# Patient Record
Sex: Female | Born: 1975 | Race: Black or African American | Hispanic: No | Marital: Married | Smoking: Former smoker
Health system: Southern US, Community
[De-identification: ages and names within clinical notes are randomized; demographics above are authoritative.]

## PROBLEM LIST (undated history)

## (undated) DIAGNOSIS — E079 Disorder of thyroid, unspecified: Secondary | ICD-10-CM

## (undated) DIAGNOSIS — N760 Acute vaginitis: Secondary | ICD-10-CM

## (undated) DIAGNOSIS — F32A Depression, unspecified: Secondary | ICD-10-CM

## (undated) DIAGNOSIS — G4700 Insomnia, unspecified: Secondary | ICD-10-CM

## (undated) DIAGNOSIS — Z800 Family history of malignant neoplasm of digestive organs: Secondary | ICD-10-CM

## (undated) DIAGNOSIS — J45909 Unspecified asthma, uncomplicated: Secondary | ICD-10-CM

## (undated) DIAGNOSIS — E039 Hypothyroidism, unspecified: Secondary | ICD-10-CM

## (undated) DIAGNOSIS — K219 Gastro-esophageal reflux disease without esophagitis: Secondary | ICD-10-CM

## (undated) DIAGNOSIS — B9689 Other specified bacterial agents as the cause of diseases classified elsewhere: Secondary | ICD-10-CM

## (undated) DIAGNOSIS — N921 Excessive and frequent menstruation with irregular cycle: Secondary | ICD-10-CM

## (undated) DIAGNOSIS — C50919 Malignant neoplasm of unspecified site of unspecified female breast: Secondary | ICD-10-CM

## (undated) DIAGNOSIS — F419 Anxiety disorder, unspecified: Secondary | ICD-10-CM

## (undated) DIAGNOSIS — Z923 Personal history of irradiation: Secondary | ICD-10-CM

## (undated) DIAGNOSIS — I10 Essential (primary) hypertension: Secondary | ICD-10-CM

## (undated) HISTORY — DX: Other specified bacterial agents as the cause of diseases classified elsewhere: N76.0

## (undated) HISTORY — DX: Malignant neoplasm of unspecified site of unspecified female breast: C50.919

## (undated) HISTORY — DX: Excessive and frequent menstruation with irregular cycle: N92.1

## (undated) HISTORY — DX: Other specified bacterial agents as the cause of diseases classified elsewhere: B96.89

## (undated) HISTORY — DX: Family history of malignant neoplasm of digestive organs: Z80.0

## (undated) HISTORY — PX: TUBAL LIGATION: SHX77

## (undated) HISTORY — DX: Disorder of thyroid, unspecified: E07.9

## (undated) HISTORY — PX: BREAST LUMPECTOMY: SHX2

---

## 1998-09-01 HISTORY — PX: LAPAROSCOPY: SHX197

## 1998-09-01 HISTORY — PX: OOPHORECTOMY: SHX86

## 2003-09-02 HISTORY — PX: DILATION AND CURETTAGE OF UTERUS: SHX78

## 2007-04-13 DIAGNOSIS — R7309 Other abnormal glucose: Secondary | ICD-10-CM | POA: Insufficient documentation

## 2007-04-13 DIAGNOSIS — K644 Residual hemorrhoidal skin tags: Secondary | ICD-10-CM | POA: Insufficient documentation

## 2007-05-04 ENCOUNTER — Ambulatory Visit: Payer: Self-pay | Admitting: Family Medicine

## 2015-03-09 ENCOUNTER — Encounter: Payer: Self-pay | Admitting: Family Medicine

## 2015-03-09 ENCOUNTER — Ambulatory Visit (INDEPENDENT_AMBULATORY_CARE_PROVIDER_SITE_OTHER): Payer: No Typology Code available for payment source | Admitting: Family Medicine

## 2015-03-09 VITALS — BP 126/84 | HR 104 | Temp 98.3°F | Resp 16 | Ht 64.5 in | Wt 167.0 lb

## 2015-03-09 DIAGNOSIS — F329 Major depressive disorder, single episode, unspecified: Secondary | ICD-10-CM | POA: Diagnosis not present

## 2015-03-09 DIAGNOSIS — I10 Essential (primary) hypertension: Secondary | ICD-10-CM | POA: Insufficient documentation

## 2015-03-09 DIAGNOSIS — J45909 Unspecified asthma, uncomplicated: Secondary | ICD-10-CM | POA: Insufficient documentation

## 2015-03-09 DIAGNOSIS — D649 Anemia, unspecified: Secondary | ICD-10-CM | POA: Insufficient documentation

## 2015-03-09 DIAGNOSIS — K219 Gastro-esophageal reflux disease without esophagitis: Secondary | ICD-10-CM | POA: Insufficient documentation

## 2015-03-09 DIAGNOSIS — G4700 Insomnia, unspecified: Secondary | ICD-10-CM | POA: Insufficient documentation

## 2015-03-09 DIAGNOSIS — J309 Allergic rhinitis, unspecified: Secondary | ICD-10-CM | POA: Insufficient documentation

## 2015-03-09 DIAGNOSIS — R519 Headache, unspecified: Secondary | ICD-10-CM | POA: Insufficient documentation

## 2015-03-09 DIAGNOSIS — R1013 Epigastric pain: Secondary | ICD-10-CM | POA: Diagnosis not present

## 2015-03-09 DIAGNOSIS — F32A Depression, unspecified: Secondary | ICD-10-CM | POA: Insufficient documentation

## 2015-03-09 DIAGNOSIS — R51 Headache: Secondary | ICD-10-CM

## 2015-03-09 DIAGNOSIS — K5900 Constipation, unspecified: Secondary | ICD-10-CM | POA: Insufficient documentation

## 2015-03-09 MED ORDER — BUPROPION HCL ER (SR) 150 MG PO TB12: 150.0000 mg | ORAL_TABLET | Freq: Two times a day (BID) | ORAL | Status: DC

## 2015-03-09 NOTE — Progress Notes (Signed)
Subjective:    Patient ID: Ebony Steele, female    DOB: [birth year], 39 y.o.   MRN: [id number]  Abdominal Pain This is a new problem. The current episode started more than 1 month ago. The onset quality is gradual. The problem occurs daily. The problem has been gradually worsening (over the last 2 weeks). The pain is located in the periumbilical region. The pain is at a severity of 5/10. The pain is severe (gets up to a 10). The quality of the pain is dull (throbbing). The abdominal pain does not radiate. Associated symptoms include anorexia, belching, constipation, diarrhea, flatus (with diarrhea) and nausea. Pertinent negatives include no arthralgias, dysuria, fever, frequency, headaches, hematochezia, hematuria, melena, myalgias, vomiting or weight loss. The pain is aggravated by eating. She has tried nothing for the symptoms.  Pt's LMP was 02/15/2015, and pt is s/p tubal ligation, as well as s/p unilateral oophorectomy.     Review of Systems  Constitutional: Negative for fever and weight loss.  Gastrointestinal: Positive for nausea, abdominal pain, diarrhea, constipation, anorexia and flatus (with diarrhea). Negative for vomiting, melena and hematochezia.  Genitourinary: Negative for dysuria, frequency and hematuria.  Musculoskeletal: Negative for myalgias and arthralgias.  Neurological: Negative for headaches.  Psychiatric/Behavioral: Positive for dysphoric mood.   BP 126/84 mmHg  Pulse 104  Temp(Src) 98.3 F (36.8 C) (Oral)  Resp 16  Ht 5' 4.5" (1.638 m)  Wt 167 lb (75.751 kg)  BMI 28.23 kg/m2  LMP 02/15/2015 (Exact Date)   Patient Active Problem List   Diagnosis Date Noted  . Allergic rhinitis 03/09/2015  . Absolute anemia 03/09/2015  . 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 Headache disorder 03/09/2015  . CN (constipation) 03/09/2015  . Clinical depression 03/09/2015  . Esophageal reflux 03/09/2015  . BP (high blood pressure) 03/09/2015  . Cannot sleep 03/09/2015  .  Adult hypothyroidism 04/19/2007  . Abnormal blood sugar 04/13/2007  . External hemorrhoids without complication 28/78/6767  . Big thyroid 04/13/2007  . Current tobacco use 04/13/2007   No past medical history on file. No current outpatient prescriptions on file prior to visit.   No current facility-administered medications on file prior to visit.   Allergies  Allergen Reactions  . Acetaminophen    Past Surgical History  Procedure Laterality Date  . Dilation and curettage of uterus  2005  . Tubal ligation    . Oophorectomy Right 2000    Tertoma  . Laparoscopy  2000   History   Social History  . Marital Status: Divorced    Spouse Name: N/A  . Number of Children: 3  . Years of Education: College   Occupational History  . 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 Smoking status: Former Smoker    Types: Cigars    Quit date: 06/01/2013  . Smokeless tobacco: Never Used     Comment: Smoked 1 cigar per day  . Alcohol Use: Yes     Comment: Occasional Wine  . Drug Use: No  . Sexual Activity: Not on file   Other Topics Concern  . Not on file   Social History Narrative   Family History  Problem Relation Age of Onset  . Diabetes Mother   . Depression Mother   . Hypertension Mother   . Glaucoma Mother   . Kidney disease Father   . Diabetes Father   . Diabetes Mellitus I Brother        Objective:  Physical Exam  Constitutional: She appears well-developed and well-nourished.  Cardiovascular: Normal rate and regular rhythm.   Pulmonary/Chest: Effort normal and breath sounds normal.  Abdominal: Soft. Bowel sounds are normal. There is tenderness (periumbilical ). There is no rebound and no guarding.   BP 126/84 mmHg  Pulse 104  Temp(Src) 98.3 F (36.8 C) (Oral)  Resp 16  Ht 5' 4.5" (1.638 m)  Wt 167 lb (75.751 kg)  BMI 28.23 kg/m2  LMP 02/15/2015 (Exact Date)     Assessment & Plan:  1. Epigastric pain Did not improve with  GI cocktail.  Will check labs and schedule ultrasound.   ER if worsens.   - CBC with Differential/Platelet - Amylase - Lipase - Comprehensive metabolic panel - GI COCKTAIL UP TO 45 CC - US Abdomen Complete; Future  2. Depression Will restart medication.   - buPROPion (WELLBUTRIN SR) 150 MG 12 hr tablet; Take 1 tablet (150 mg total) by mouth 2 (two) times daily.  Dispense: 60 tablet; Refill: 6   Margarita Rana, MD

## 2015-03-10 LAB — CBC WITH DIFFERENTIAL/PLATELET
BASOS ABS: 0 10*3/uL (ref 0.0–0.2)
Basos: 0 %
EOS (ABSOLUTE): 0.3 10*3/uL (ref 0.0–0.4)
EOS: 5 %
Hematocrit: 42.2 % (ref 34.0–46.6)
Hemoglobin: 14.3 g/dL (ref 11.1–15.9)
IMMATURE GRANS (ABS): 0 10*3/uL (ref 0.0–0.1)
IMMATURE GRANULOCYTES: 0 %
Lymphocytes Absolute: 2.5 10*3/uL (ref 0.7–3.1)
Lymphs: 49 %
MCH: 29.1 pg (ref 26.6–33.0)
MCHC: 33.9 g/dL (ref 31.5–35.7)
MCV: 86 fL (ref 79–97)
MONOCYTES: 8 %
Monocytes Absolute: 0.4 10*3/uL (ref 0.1–0.9)
Neutrophils Absolute: 1.9 10*3/uL (ref 1.4–7.0)
Neutrophils: 38 %
PLATELETS: 227 10*3/uL (ref 150–379)
RBC: 4.91 x10E6/uL (ref 3.77–5.28)
RDW: 13.8 % (ref 12.3–15.4)
WBC: 5.1 10*3/uL (ref 3.4–10.8)

## 2015-03-10 LAB — COMPREHENSIVE METABOLIC PANEL
A/G RATIO: 1.5 (ref 1.1–2.5)
ALT: 19 IU/L (ref 0–32)
AST: 17 IU/L (ref 0–40)
Albumin: 4.5 g/dL (ref 3.5–5.5)
Alkaline Phosphatase: 67 IU/L (ref 39–117)
BUN/Creatinine Ratio: 11 (ref 8–20)
BUN: 10 mg/dL (ref 6–20)
Bilirubin Total: 0.4 mg/dL (ref 0.0–1.2)
CO2: 22 mmol/L (ref 18–29)
Calcium: 9 mg/dL (ref 8.7–10.2)
Chloride: 103 mmol/L (ref 97–108)
Creatinine, Ser: 0.91 mg/dL (ref 0.57–1.00)
GFR calc Af Amer: 92 mL/min/{1.73_m2} (ref >59)
GFR, EST NON AFRICAN AMERICAN: 80 mL/min/{1.73_m2} (ref >59)
GLUCOSE: 90 mg/dL (ref 65–99)
Globulin, Total: 3 g/dL (ref 1.5–4.5)
POTASSIUM: 4.1 mmol/L (ref 3.5–5.2)
SODIUM: 140 mmol/L (ref 134–144)
Total Protein: 7.5 g/dL (ref 6.0–8.5)

## 2015-03-10 LAB — LIPASE: LIPASE: 30 U/L (ref 0–59)

## 2015-03-10 LAB — AMYLASE: Amylase: 118 U/L (ref 31–124)

## 2015-03-12 ENCOUNTER — Telehealth: Payer: Self-pay

## 2015-03-12 NOTE — Telephone Encounter (Signed)
-----   Message from Margarita Rana, MD sent at 03/10/2015  6:26 AM EDT ----- Labs normal. Please notify patient. Will await ultrasound results. Please see how patient is feeling.  Thanks.

## 2015-03-12 NOTE — Telephone Encounter (Signed)
Pt advised as directed below.  Her Ultrasound is scheduled for tomorrow. (03/13/2015)  Thanks,   -Mickel Baas

## 2015-03-13 ENCOUNTER — Telehealth: Payer: Self-pay

## 2015-03-13 ENCOUNTER — Ambulatory Visit
Admission: RE | Admit: 2015-03-13 | Discharge: 2015-03-13 | Disposition: A | Discharge status: Home / Self Care | Payer: 59 | Source: Ambulatory Visit | Attending: Family Medicine | Admitting: Family Medicine

## 2015-03-13 ENCOUNTER — Ambulatory Visit: Payer: Self-pay | Admitting: Family Medicine

## 2015-03-13 DIAGNOSIS — R109 Unspecified abdominal pain: Secondary | ICD-10-CM | POA: Diagnosis present

## 2015-03-13 DIAGNOSIS — R1013 Epigastric pain: Secondary | ICD-10-CM | POA: Insufficient documentation

## 2015-03-13 NOTE — Telephone Encounter (Signed)
Pt advised as directed below.  She is still having pain, but does not want to go to the GI yet.  She says she will call back when she is ready for the referral.   Thanks,   -Mickel Baas

## 2015-03-13 NOTE — Telephone Encounter (Signed)
-----   Message from Margarita Rana, MD sent at 03/13/2015 10:22 AM EDT ----- Ultrasound normal. Unclear why having symptoms. Can refer to GI if still with pain. Thanks.

## 2015-04-04 ENCOUNTER — Ambulatory Visit: Payer: No Typology Code available for payment source | Admitting: Family Medicine

## 2015-04-06 ENCOUNTER — Ambulatory Visit: Payer: No Typology Code available for payment source | Admitting: Family Medicine

## 2015-09-07 ENCOUNTER — Encounter: Payer: Self-pay | Admitting: Family Medicine

## 2015-09-07 ENCOUNTER — Ambulatory Visit (INDEPENDENT_AMBULATORY_CARE_PROVIDER_SITE_OTHER): Payer: 59 | Admitting: Family Medicine

## 2015-09-07 VITALS — BP 122/90 | HR 94 | Temp 98.3°F | Resp 16 | Ht 64.0 in | Wt 155.0 lb

## 2015-09-07 DIAGNOSIS — R634 Abnormal weight loss: Secondary | ICD-10-CM | POA: Diagnosis not present

## 2015-09-07 DIAGNOSIS — R55 Syncope and collapse: Secondary | ICD-10-CM | POA: Diagnosis not present

## 2015-09-07 DIAGNOSIS — R1013 Epigastric pain: Secondary | ICD-10-CM | POA: Diagnosis not present

## 2015-09-07 DIAGNOSIS — Z0000 Encounter for general adult medical examination without abnormal findings: Secondary | ICD-10-CM | POA: Diagnosis not present

## 2015-09-07 LAB — IFOBT (OCCULT BLOOD): IMMUNOLOGICAL FECAL OCCULT BLOOD TEST: NEGATIVE

## 2015-09-07 NOTE — Progress Notes (Signed)
Patient ID: Ebony Steele, female   DOB: 1975/11/24, 40 y.o.   MRN: [id number]       Patient: Ebony Steele, Female    DOB: [birth year], 40 y.o.   MRN: [id number] Visit Date: 09/07/2015  Today's Provider: Margarita Rana, MD   Chief Complaint  Patient presents with  . Annual Exam   Subjective:    Annual physical exam Ebony Steele is a 40 y.o. female who presents today for health maintenance and complete physical. She feels well. She reports exercising none. She reports she is sleeping well.  07/21/14 CPE 01/28/12 Pap-neg 12/12/10 HPV-neg 01/28/12 EKG  Lab Results  Component Value Date   WBC 5.1 03/09/2015   HCT 42.2 03/09/2015   PLT 227 03/09/2015   GLUCOSE 90 03/09/2015   ALT 19 03/09/2015   AST 17 03/09/2015   NA 140 03/09/2015   K 4.1 03/09/2015   CL 103 03/09/2015   CREATININE 0.91 03/09/2015   BUN 10 03/09/2015   CO2 22 03/09/2015   -----------------------------------------------------------------  Syncope: Patient complains of syncope. Onset was 6 days ago, with stable course since that time. Patient describes the episode as episode witnessed and the following was observed: mental status was normal immediately upon regaining consciousness. Patient also has associated symptoms of  tachycardia/palpitations, visual aura and dizzines. The patient denies melena. Taking culprit meds: none   Abdominal Pain: Patient complains of abdominal pain. The pain is described as aching, dull and sharp at times, and is 8/10 in intensity. Pain is located in the periumbilical bilateral without radiation. Onset was 6 months ago. Symptoms have been gradually worsening since. Aggravating factors: eating.  Alleviating factors: none. Associated symptoms: constipation and diarrhea. The patient denies melena.    Review of Systems  Constitutional: Positive for appetite change, fatigue and unexpected weight change.  Eyes: Negative.   Respiratory: Negative.   Cardiovascular: Negative.    Gastrointestinal: Positive for nausea, abdominal pain, diarrhea and constipation.  Endocrine: Negative.   Genitourinary: Negative.   Musculoskeletal: Negative.   Skin: Negative.   Allergic/Immunologic: Negative.   Neurological: Positive for dizziness, syncope, light-headedness and headaches.  Hematological: Negative.   Psychiatric/Behavioral: The patient is nervous/anxious.     Social History      She  reports that she has been smoking Cigars.  She has never used smokeless tobacco. She reports that she drinks alcohol. She reports that she does not use illicit drugs.       Social History   Social History  . Marital Status: Divorced    Spouse Name: N/A  . Number of Children: 3  . Years of Education: College   Occupational History  . 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 Smoking status: Current Some Day Smoker    Types: Cigars  . Smokeless tobacco: Never Used     Comment: Smoked 1 cigar per day  . Alcohol Use: Yes     Comment: Occasional Wine  . Drug Use: No  . Sexual Activity: Not Asked   Other Topics Concern  . None   Social History Narrative    Past Medical History  Diagnosis Date  . Thyroid disease      Patient Active Problem List   Diagnosis Date Noted  . Allergic rhinitis 03/09/2015  . Absolute anemia 03/09/2015  . 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 Headache disorder 03/09/2015  . CN (constipation) 03/09/2015  . Clinical depression 03/09/2015  . Esophageal reflux 03/09/2015  .  BP (high blood pressure) 03/09/2015  . Cannot sleep 03/09/2015  . Adult hypothyroidism 04/19/2007  . Abnormal blood sugar 04/13/2007  . External hemorrhoids without complication [id number]  . Big thyroid 04/13/2007  . Current tobacco use 04/13/2007    Past Surgical History  Procedure Laterality Date  . Dilation and curettage of uterus  2005  . Tubal ligation    . Oophorectomy Right 2000    Tertoma  . Laparoscopy  2000     Family History        Family Status  Relation Status Death Age  . Mother Alive   . Father Deceased   . Brother Alive         Her family history includes Depression in her mother; Diabetes in her father and mother; Diabetes Mellitus I in her brother; Glaucoma in her mother; Hypertension in her mother; Kidney disease in her father.    Allergies  Allergen Reactions  . Acetaminophen     Previous Medications   ALBUTEROL (PROVENTIL HFA) 108 (90 BASE) MCG/ACT INHALER    Inhale into the lungs.   BUPROPION (WELLBUTRIN SR) 150 MG 12 HR TABLET    Take 1 tablet (150 mg total) by mouth 2 (two) times daily.   CETIRIZINE (ZYRTEC ALLERGY) 10 MG TABLET    Take by mouth.   LEVOTHYROXINE (SYNTHROID, LEVOTHROID) 88 MCG TABLET    Take by mouth.   ONDANSETRON (ZOFRAN) 4 MG TABLET    Take 4 mg by mouth every 8 (eight) hours as needed for nausea or vomiting.   TRAZODONE (DESYREL) 50 MG TABLET    Take by mouth.    Patient Care Team: Margarita Rana, MD as PCP - General (Family Medicine)     Objective:   Vitals: BP 122/90 mmHg  Pulse 94  Temp(Src) 98.3 F (36.8 C)  Resp 16  Ht 5\' 4"  (1.626 m)  Wt 155 lb (70.308 kg)  BMI 26.59 kg/m2  SpO2 98%  LMP 08/25/2015 (Exact Date)   Physical Exam  Constitutional: She is oriented to person, place, and time. She appears well-developed and well-nourished.  HENT:  Head: Normocephalic and atraumatic.  Right Ear: Tympanic membrane, external ear and ear canal normal.  Left Ear: Tympanic membrane, external ear and ear canal normal.  Nose: Nose normal.  Mouth/Throat: Uvula is midline, oropharynx is clear and moist and mucous membranes are normal.  Eyes: Conjunctivae, EOM and lids are normal. Pupils are equal, round, and reactive to light.  Neck: Trachea normal and normal range of motion. Neck supple. Carotid bruit is not present. No thyroid mass and no thyromegaly present.  Cardiovascular: Normal rate, regular rhythm and normal heart sounds.    Pulmonary/Chest: Effort normal and breath sounds normal.  Abdominal: Soft. Normal appearance and bowel sounds are normal. There is no hepatosplenomegaly. There is no tenderness.  Genitourinary: No breast swelling, tenderness or discharge.  Musculoskeletal: Normal range of motion.  Lymphadenopathy:    She has no cervical adenopathy.    She has no axillary adenopathy.  Neurological: She is alert and oriented to person, place, and time. She has normal strength. No cranial nerve deficit.  Skin: Skin is warm, dry and intact.  Psychiatric: She has a normal mood and affect. Her speech is normal and behavior is normal. Judgment and thought content normal. Cognition and memory are normal.     Depression Screen PHQ 2/9 Scores 09/07/2015  PHQ - 2 Score 5  PHQ- 9 Score 19      Assessment & Plan:  Routine Health Maintenance and Physical Exam  Exercise Activities and Dietary recommendations Goals    . Exercise 150 minutes per week (moderate activity)       Immunization History  Administered Date(s) Administered  . Tdap 01/28/2012    1. Annual physical exam Normal exam today.  - EKG 12-Lead  2. Abnormal weight loss Has continued to have abdominal pain, now with weight loss.  Decreased appetite.  Will check labs, schedule CT scan, and refer to GI if all normal.   Needs to follow thru with work up at this time.   - IFOBT POC (occult bld, rslt in office) - Ambulatory referral to Gastroenterology - CBC with Differential/Platelet - Comprehensive metabolic panel - TSH - CT Abdomen Pelvis W Contrast; Future  3. Syncope and collapse EKG normal today. Did have pre-syncopal symptoms. Suspect related getting up too quickly and not eating.   Recommend increase eating and drinking.    4. Epigastric pain Will refer to GI.   - Ambulatory referral to Gastroenterology - CT Abdomen Pelvis W Contrast; Future   Patient was seen and examined by Jerrell Belfast, MD, and note scribed by  Lynford Humphrey, [place].   I have reviewed the document for accuracy and completeness and I agree with above. Jerrell Belfast, MD   Margarita Rana, MD    --------------------------------------------------------------------

## 2015-09-08 LAB — CBC WITH DIFFERENTIAL/PLATELET
Basophils Absolute: 0 10*3/uL (ref 0.0–0.2)
Basos: 1 %
EOS (ABSOLUTE): 0.2 10*3/uL (ref 0.0–0.4)
Eos: 5 %
Hematocrit: 41.2 % (ref 34.0–46.6)
Hemoglobin: 13.8 g/dL (ref 11.1–15.9)
Immature Grans (Abs): 0 10*3/uL (ref 0.0–0.1)
Immature Granulocytes: 0 %
LYMPHS ABS: 1.8 10*3/uL (ref 0.7–3.1)
LYMPHS: 40 %
MCH: 27.9 pg (ref 26.6–33.0)
MCHC: 33.5 g/dL (ref 31.5–35.7)
MCV: 83 fL (ref 79–97)
MONOS ABS: 0.5 10*3/uL (ref 0.1–0.9)
Monocytes: 12 %
NEUTROS PCT: 42 %
Neutrophils Absolute: 1.9 10*3/uL (ref 1.4–7.0)
Platelets: 268 10*3/uL (ref 150–379)
RBC: 4.95 x10E6/uL (ref 3.77–5.28)
RDW: 13 % (ref 12.3–15.4)
WBC: 4.6 10*3/uL (ref 3.4–10.8)

## 2015-09-08 LAB — COMPREHENSIVE METABOLIC PANEL
ALT: 41 IU/L — ABNORMAL HIGH (ref 0–32)
AST: 32 IU/L (ref 0–40)
Albumin/Globulin Ratio: 1.3 (ref 1.1–2.5)
Albumin: 4.3 g/dL (ref 3.5–5.5)
Alkaline Phosphatase: 78 IU/L (ref 39–117)
BILIRUBIN TOTAL: 0.4 mg/dL (ref 0.0–1.2)
BUN / CREAT RATIO: 9 (ref 8–20)
BUN: 8 mg/dL (ref 6–20)
CHLORIDE: 101 mmol/L (ref 96–106)
CO2: 21 mmol/L (ref 18–29)
CREATININE: 0.87 mg/dL (ref 0.57–1.00)
Calcium: 9.2 mg/dL (ref 8.7–10.2)
GFR calc Af Amer: 97 mL/min/{1.73_m2} (ref >59)
GFR calc non Af Amer: 84 mL/min/{1.73_m2} (ref >59)
GLOBULIN, TOTAL: 3.2 g/dL (ref 1.5–4.5)
Glucose: 81 mg/dL (ref 65–99)
POTASSIUM: 3.9 mmol/L (ref 3.5–5.2)
SODIUM: 136 mmol/L (ref 134–144)
Total Protein: 7.5 g/dL (ref 6.0–8.5)

## 2015-09-08 LAB — TSH: TSH: 2.11 u[IU]/mL (ref 0.450–4.500)

## 2015-09-10 ENCOUNTER — Encounter: Payer: Self-pay | Admitting: Gastroenterology

## 2015-09-17 ENCOUNTER — Telehealth: Payer: Self-pay

## 2015-09-17 ENCOUNTER — Other Ambulatory Visit: Payer: Self-pay | Admitting: Family Medicine

## 2015-09-17 ENCOUNTER — Ambulatory Visit
Admission: RE | Admit: 2015-09-17 | Discharge: 2015-09-17 | Disposition: A | Discharge status: Home / Self Care | Payer: 59 | Source: Ambulatory Visit | Attending: Family Medicine | Admitting: Family Medicine

## 2015-09-17 ENCOUNTER — Telehealth: Payer: Self-pay | Admitting: Family Medicine

## 2015-09-17 DIAGNOSIS — K5900 Constipation, unspecified: Secondary | ICD-10-CM | POA: Insufficient documentation

## 2015-09-17 DIAGNOSIS — R1013 Epigastric pain: Secondary | ICD-10-CM

## 2015-09-17 DIAGNOSIS — R634 Abnormal weight loss: Secondary | ICD-10-CM | POA: Insufficient documentation

## 2015-09-17 DIAGNOSIS — N83201 Unspecified ovarian cyst, right side: Secondary | ICD-10-CM

## 2015-09-17 DIAGNOSIS — R918 Other nonspecific abnormal finding of lung field: Secondary | ICD-10-CM | POA: Insufficient documentation

## 2015-09-17 DIAGNOSIS — R9389 Abnormal findings on diagnostic imaging of other specified body structures: Secondary | ICD-10-CM

## 2015-09-17 DIAGNOSIS — N83209 Unspecified ovarian cyst, unspecified side: Secondary | ICD-10-CM | POA: Insufficient documentation

## 2015-09-17 HISTORY — DX: Essential (primary) hypertension: I10

## 2015-09-17 HISTORY — DX: Unspecified asthma, uncomplicated: J45.909

## 2015-09-17 MED ORDER — IOHEXOL 300 MG/ML  SOLN
100.0000 mL | Freq: Once | INTRAMUSCULAR | Status: AC | PRN
  Administered 2015-09-17: 100 mL via INTRAVENOUS

## 2015-09-17 NOTE — Telephone Encounter (Signed)
Spoke to Dr. Janeece Fitting he reports that this is not related to history of asthma.

## 2015-09-17 NOTE — Telephone Encounter (Signed)
-----   Message from Margarita Rana, MD sent at 09/17/2015  1:30 PM EST ----- Talked with patient. No breathing issues.  Please call and leave message with radiologist if lung finding can be found in patient with history of asthma. Thanks.

## 2015-09-17 NOTE — Telephone Encounter (Signed)
Talked with patient. Does not want to pursue lung findings.  Will schedule ultrasound.

## 2015-09-18 DIAGNOSIS — R9389 Abnormal findings on diagnostic imaging of other specified body structures: Secondary | ICD-10-CM | POA: Insufficient documentation

## 2015-09-20 ENCOUNTER — Ambulatory Visit: Payer: 59

## 2015-10-09 ENCOUNTER — Ambulatory Visit: Payer: No Typology Code available for payment source | Admitting: Gastroenterology

## 2015-10-20 ENCOUNTER — Other Ambulatory Visit: Payer: Self-pay | Admitting: Family Medicine

## 2015-10-20 DIAGNOSIS — E039 Hypothyroidism, unspecified: Secondary | ICD-10-CM

## 2015-10-20 DIAGNOSIS — G4700 Insomnia, unspecified: Secondary | ICD-10-CM

## 2015-12-11 ENCOUNTER — Encounter: Payer: Self-pay | Admitting: Family Medicine

## 2015-12-11 ENCOUNTER — Ambulatory Visit (INDEPENDENT_AMBULATORY_CARE_PROVIDER_SITE_OTHER): Payer: Self-pay | Admitting: Family Medicine

## 2015-12-11 VITALS — BP 128/84 | HR 62 | Temp 98.0°F | Resp 14 | Wt 158.0 lb

## 2015-12-11 DIAGNOSIS — N3090 Cystitis, unspecified without hematuria: Secondary | ICD-10-CM

## 2015-12-11 DIAGNOSIS — R300 Dysuria: Secondary | ICD-10-CM

## 2015-12-11 MED ORDER — NITROFURANTOIN MONOHYD MACRO 100 MG PO CAPS: 100.0000 mg | ORAL_CAPSULE | Freq: Two times a day (BID) | ORAL | Status: DC

## 2015-12-11 NOTE — Progress Notes (Signed)
Patient ID: [name], female   DOB: 12-03-1975, 40 y.o.   MRN: [id number]   Patient: Ebony Steele Female    DOB: [birth year]   40 y.o.   MRN: [id number] Visit Date: 12/11/2015  Today's Provider: Vernie Murders, PA   Chief Complaint  Patient presents with  . Dysuria   Subjective:    Dysuria  This is a new problem. The current episode started yesterday. The problem occurs every urination. The problem has been unchanged. There has been no fever. Associated symptoms include frequency and urgency. Treatments tried: OTC medication. The treatment provided no relief.    Past Medical History  Diagnosis Date  . Thyroid disease   . Asthma   . Hypertension    Past Surgical History  Procedure Laterality Date  . Dilation and curettage of uterus  2005  . Tubal ligation    . Oophorectomy Right 2000    Tertoma  . Laparoscopy  2000   Family History  Problem Relation Age of Onset  . Diabetes Mother   . Depression Mother   . Hypertension Mother   . Glaucoma Mother   . Kidney disease Father   . Diabetes Father   . Diabetes Mellitus I Brother    Previous Medications   ALBUTEROL (PROVENTIL HFA) 108 (90 BASE) MCG/ACT INHALER    Inhale into the lungs.   BUPROPION (WELLBUTRIN SR) 150 MG 12 HR TABLET    Take 1 tablet (150 mg total) by mouth 2 (two) times daily.   CETIRIZINE (ZYRTEC ALLERGY) 10 MG TABLET    Take by mouth.   LEVOTHYROXINE (SYNTHROID, LEVOTHROID) 88 MCG TABLET    TAKE ONE TABLET BY MOUTH ONCE DAILY   ONDANSETRON (ZOFRAN) 4 MG TABLET    Take 4 mg by mouth every 8 (eight) hours as needed for nausea or vomiting.   TRAZODONE (DESYREL) 50 MG TABLET    TAKE ONE TABLET BY MOUTH AT BEDTIME   Allergies  Allergen Reactions  . Acetaminophen     Review of Systems  Constitutional: Negative.   HENT: Negative.   Eyes: Negative.   Respiratory: Negative.   Cardiovascular: Negative.   Gastrointestinal: Negative.   Endocrine: Negative.   Genitourinary: Positive for dysuria,  urgency and frequency.  Musculoskeletal: Negative.   Skin: Negative.   Allergic/Immunologic: Negative.   Neurological: Negative.   Hematological: Negative.   Psychiatric/Behavioral: Negative.     Social History  Substance Use Topics  . Smoking status: Current Some Day Smoker    Types: Cigars  . Smokeless tobacco: Never Used     Comment: Smoked 1 cigar per day  . Alcohol Use: Yes     Comment: Occasional Wine   Objective:   BP 128/84 mmHg  Pulse 62  Temp(Src) 98 F (36.7 C) (Oral)  Resp 14  Wt 158 lb (71.668 kg)  Physical Exam  Constitutional: She is oriented to person, place, and time. She appears well-developed and well-nourished. No distress.  HENT:  Head: Normocephalic and atraumatic.  Right Ear: Hearing normal.  Left Ear: Hearing normal.  Nose: Nose normal.  Eyes: Conjunctivae and lids are normal. Right eye exhibits no discharge. Left eye exhibits no discharge. No scleral icterus.  Cardiovascular: Normal rate and regular rhythm.   Pulmonary/Chest: Effort normal and breath sounds normal. No respiratory distress.  Abdominal: Bowel sounds are normal. She exhibits no mass. There is tenderness. There is no guarding.  Suprapubic discomfort to palpation. No CVA tenderness to percussion posteriorly.  Musculoskeletal: Normal range of motion.  Neurological: She is alert and oriented to person, place, and time.  Skin: Skin is intact. No lesion and no rash noted.  Psychiatric: She has a normal mood and affect. Her speech is normal and behavior is normal. Thought content normal.      Assessment & Plan:     1. Dysuria Onset yesterday with some pink-tinge to urine color today. Increased frequency and urgency. Cystex helped relieve burning and bladder pain. No fever. Will get urine C&S and start antibiotic. - POCT urinalysis dipstick  2. Cystitis Urinalysis showed TNTC RBC's with bacteria and WBC's. Will treat with Macrobid and encouraged to drink extra fluids. Recheck pending  culture report. - Urine culture - nitrofurantoin, macrocrystal-monohydrate, (MACROBID) 100 MG capsule; Take 1 capsule (100 mg total) by mouth 2 (two) times daily.  Dispense: 20 capsule; Refill: 0

## 2015-12-13 LAB — URINE CULTURE

## 2015-12-14 ENCOUNTER — Telehealth: Payer: Self-pay

## 2015-12-14 NOTE — Telephone Encounter (Signed)
Patient advised as directed below. Patient verbalized understanding.  

## 2015-12-14 NOTE — Telephone Encounter (Signed)
-----   Message from Margo Common, [place] sent at 12/13/2015  4:44 PM EDT ----- Final report of urine culture confirms antibiotic given should clear this out. Recheck urinalysis in the office in 7-10 days if any symptoms remain.

## 2016-07-07 ENCOUNTER — Ambulatory Visit: Payer: PRIVATE HEALTH INSURANCE | Admitting: Family Medicine

## 2016-07-18 ENCOUNTER — Telehealth: Payer: Self-pay | Admitting: Family Medicine

## 2016-07-18 MED ORDER — BUPROPION HCL ER (SR) 150 MG PO TB12: 150.0000 mg | ORAL_TABLET | Freq: Two times a day (BID) | ORAL | 1 refills | Status: DC

## 2016-07-18 NOTE — Telephone Encounter (Signed)
Patient is requesting a refill on her buPROPion (WELLBUTRIN SR) 150 MG 12 hr tablet  Called in [place]

## 2016-07-18 NOTE — Telephone Encounter (Signed)
Advise patient medication refilled for the next 2 months. Is due for follow up appointment before she runs out.

## 2016-07-21 NOTE — Telephone Encounter (Signed)
Patient advised.

## 2016-09-16 ENCOUNTER — Encounter: Payer: Self-pay | Admitting: Physician Assistant

## 2016-09-16 ENCOUNTER — Ambulatory Visit (INDEPENDENT_AMBULATORY_CARE_PROVIDER_SITE_OTHER): Payer: Managed Care, Other (non HMO) | Admitting: Physician Assistant

## 2016-09-16 VITALS — BP 132/86 | HR 84 | Temp 98.1°F | Resp 16 | Ht 64.0 in | Wt 139.0 lb

## 2016-09-16 DIAGNOSIS — Z1231 Encounter for screening mammogram for malignant neoplasm of breast: Secondary | ICD-10-CM | POA: Diagnosis not present

## 2016-09-16 DIAGNOSIS — J4520 Mild intermittent asthma, uncomplicated: Secondary | ICD-10-CM

## 2016-09-16 DIAGNOSIS — D649 Anemia, unspecified: Secondary | ICD-10-CM

## 2016-09-16 DIAGNOSIS — Z0000 Encounter for general adult medical examination without abnormal findings: Secondary | ICD-10-CM | POA: Diagnosis not present

## 2016-09-16 DIAGNOSIS — E039 Hypothyroidism, unspecified: Secondary | ICD-10-CM | POA: Diagnosis not present

## 2016-09-16 DIAGNOSIS — Z7251 High risk heterosexual behavior: Secondary | ICD-10-CM

## 2016-09-16 DIAGNOSIS — F339 Major depressive disorder, recurrent, unspecified: Secondary | ICD-10-CM

## 2016-09-16 DIAGNOSIS — R634 Abnormal weight loss: Secondary | ICD-10-CM

## 2016-09-16 DIAGNOSIS — Z1239 Encounter for other screening for malignant neoplasm of breast: Secondary | ICD-10-CM

## 2016-09-16 MED ORDER — ALBUTEROL SULFATE HFA 108 (90 BASE) MCG/ACT IN AERS: 2.0000 | INHALATION_SPRAY | RESPIRATORY_TRACT | 1 refills | Status: DC | PRN

## 2016-09-16 MED ORDER — BUPROPION HCL ER (SR) 150 MG PO TB12: 150.0000 mg | ORAL_TABLET | Freq: Two times a day (BID) | ORAL | 1 refills | Status: DC

## 2016-09-16 MED ORDER — LEVOTHYROXINE SODIUM 88 MCG PO TABS
88.0000 ug | ORAL_TABLET | Freq: Every day | ORAL | 1 refills | Status: DC
Start: 2016-09-16 — End: 2017-11-27

## 2016-09-16 NOTE — Progress Notes (Signed)
Patient: Ebony Steele, Female    DOB: September 26, 1975, 41 y.o.   MRN: [id number] Visit Date: 09/16/2016  Today's Provider: Trinna Post, PA-C   Chief Complaint  Patient presents with  . Annual Exam   Subjective:    Annual physical exam Ebony Steele is a 41 y.o. female who presents today for health maintenance and complete physical. She feels fairly well. Pt reports she has some personal things going on.  She reports not exercising. She reports she is sleeping poorly.   She is on Wellbutrin 150 mg BID daily for clinical depression, which she is taking as 300 mg QD since she can't sleep with the split dose. She feels her depression has worsened since her 55 year old daughter went to live with her father in [place]. She also has an 41 year old daughter at [employer] who may not be doing well academically and a 52 y/o daughter as well. She is seeing a Surveyor, mining who she feels is helpful to her. Denies SI/HI today.  She also had some relationship issues with her now fiance. She was dating him for a period and they split up. She was sexually active with other partners and did not use protection. She is now back with her fiance and they are set to be married in 2020. She is concerned today because she noticed a vaginal odor around her menstrual cycles and also a lesion that after she soaked in the tub drained pus.   She is in need of a mammogram today, no personal or family history of breast cancer.  She is non compliant with her Synthroid for her hypothyroidism. She reports she hasn't taken it in months. She denies hypothyroid symptoms.  She is having to use her albuterol inhaler 3 times per day the past week 2/2  URI and cold weather. Other than that she says she uses it occasionally.  She has a history of weight loss, abdominal pain, and nausea. Her CT abdomen pelvis last year was normal. She declined referral to GI as she was not vomiting and had no bloody stools. She reports she  doesn't eat much and does not have an appetite. For instance, today she ate half a chicken biscuit and some coffee.  She is due for a PAP/HPV test today. She needs a mammogram. No family hx of breast cancer.  She has a 2.5 pack year smoking hx and smokes occasionally. Drinks one glass of wine every few weeks.   She lives in [place] with her Administrator, [employer].   ----------------------------------------------------------------  Review of Systems  Constitutional: Positive for appetite change and diaphoresis (2/2 wellbutrin). Negative for chills.  HENT: Negative.   Eyes: Negative.   Respiratory: Negative.   Cardiovascular: Negative.   Gastrointestinal: Positive for constipation and nausea. Negative for blood in stool, rectal pain and vomiting.  Endocrine: Negative.   Genitourinary: Positive for genital sores and vaginal discharge.  Musculoskeletal: Negative.   Allergic/Immunologic: Negative.   Neurological: Negative.   Hematological: Negative.   Psychiatric/Behavioral: Positive for sleep disturbance. Negative for suicidal ideas.         Social History      She  reports that she has been smoking Cigars.  She has never used smokeless tobacco. She reports that she drinks alcohol. She reports that she does not use drugs.       Social History   Social History  . Marital status: Divorced    Spouse name: N/A  .  Number of children: 3  . Years of education: College   Occupational History  . 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 Smoking status: Current Some Day Smoker    Types: Cigars  . Smokeless tobacco: Never Used     Comment: Smoked 1 cigar per day  . Alcohol use Yes     Comment: Occasional Wine  . Drug use: No  . Sexual activity: Not Asked   Other Topics Concern  . None   Social History Narrative  . None    Past Medical History:  Diagnosis Date  . Asthma   . Hypertension   . Thyroid disease      Patient Active Problem List     Diagnosis Date Noted  . Abnormal chest CT 09/18/2015  . Cyst of ovary 09/17/2015  . Allergic rhinitis 03/09/2015  . Absolute anemia 03/09/2015  . 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 Headache disorder 03/09/2015  . CN (constipation) 03/09/2015  . Clinical depression 03/09/2015  . Esophageal reflux 03/09/2015  . BP (high blood pressure) 03/09/2015  . Insomnia 03/09/2015  . Adult hypothyroidism 04/19/2007  . Abnormal blood sugar 04/13/2007  . External hemorrhoids without complication 50/35/4656  . Big thyroid 04/13/2007  . Current tobacco use 04/13/2007    Past Surgical History:  Procedure Laterality Date  . DILATION AND CURETTAGE OF UTERUS  2005  . LAPAROSCOPY  2000  . OOPHORECTOMY Right 2000   [place]  . TUBAL LIGATION      Family History        Family Status  Relation Status  . Mother Alive  . Father Deceased  . Brother Alive        Her family history includes Depression in her mother; Diabetes in her father and mother; Diabetes Mellitus I in her brother; Glaucoma in her mother; Hypertension in her mother; Kidney disease in her father.     Allergies  Allergen Reactions  . Acetaminophen      Current Outpatient Prescriptions:  .  albuterol (PROVENTIL HFA) 108 (90 BASE) MCG/ACT inhaler, Inhale into the lungs., Disp: , Rfl:  .  buPROPion (WELLBUTRIN SR) 150 MG 12 hr tablet, Take 1 tablet (150 mg total) by mouth 2 (two) times daily., Disp: 60 tablet, Rfl: 1 .  cetirizine (ZYRTEC ALLERGY) 10 MG tablet, Take by mouth., Disp: , Rfl:  .  levothyroxine (SYNTHROID, LEVOTHROID) 88 MCG tablet, TAKE ONE TABLET BY MOUTH ONCE DAILY, Disp: 90 tablet, Rfl: 1 .  ondansetron (ZOFRAN) 4 MG tablet, Take 4 mg by mouth every 8 (eight) hours as needed for nausea or vomiting., Disp: , Rfl:  .  traZODone (DESYREL) 50 MG tablet, TAKE ONE TABLET BY MOUTH AT BEDTIME, Disp: 90 tablet, Rfl: 1   Patient Care Team: Trinna Post, PA-C as PCP - General (Physician Assistant)       Objective:   Vitals: BP 132/86 (BP Location: Left Arm, Patient Position: Sitting, Cuff Size: Normal)   Pulse 84   Temp 98.1 F (36.7 C) (Oral)   Resp 16   Ht '5\' 4"'  (1.626 m)   Wt 139 lb (63 kg)   LMP 09/09/2016   BMI 23.86 kg/m    Physical Exam  Constitutional: She is oriented to person, place, and time. She appears well-developed and well-nourished. No distress.  HENT:  Right Ear: Tympanic membrane normal.  Left Ear: Tympanic membrane normal.  Mouth/Throat: Oropharynx is clear and moist. No oropharyngeal exudate.  Eyes:  Conjunctivae are normal.  Neck: Neck supple. No tracheal deviation present. No thyromegaly present.  Cardiovascular: Normal rate and regular rhythm.   Pulmonary/Chest: Effort normal and breath sounds normal. Right breast exhibits no inverted nipple, no mass, no nipple discharge, no skin change and no tenderness. Left breast exhibits no inverted nipple, no mass, no nipple discharge, no skin change and no tenderness. Breasts are symmetrical.  Abdominal: Soft. Bowel sounds are normal. She exhibits no distension and no mass. There is no tenderness. There is no rebound and no guarding.  Genitourinary: No labial fusion. There is lesion on the right labia. There is no rash, tenderness or injury on the right labia. There is no rash, tenderness, lesion or injury on the left labia. Uterus is not deviated, not enlarged, not fixed and not tender. Cervix exhibits no motion tenderness, no discharge and no friability. Right adnexum displays no mass, no tenderness and no fullness. Left adnexum displays no mass, no tenderness and no fullness. No erythema, tenderness or bleeding in the vagina. No foreign body in the vagina. No signs of injury around the vagina. No vaginal discharge found.  Genitourinary Comments: There is a nodular feeling lesion on left labia minor with some slight erythema but no tenderness, drainage, bleeding or pus. Does not look vesicular.  Lymphadenopathy:    She  has no cervical adenopathy.  Neurological: She is alert and oriented to person, place, and time.  Skin: Skin is warm and dry.  Psychiatric: She has a normal mood and affect. Her behavior is normal.     Depression Screen PHQ 2/9 Scores 09/16/2016 09/07/2015  PHQ - 2 Score 6 5  PHQ- 9 Score - 19      Assessment & Plan:     Routine Health Maintenance and Physical Exam  Exercise Activities and Dietary recommendations Goals    . Exercise 150 minutes per week (moderate activity)       Immunization History  Administered Date(s) Administered  . Tdap 01/28/2012    Health Maintenance  Topic Date Due  . HIV Screening  01/08/1991  . PAP SMEAR  01/07/1997  . INFLUENZA VACCINE  05/02/2017 (Originally 04/01/2016)  . TETANUS/TDAP  01/27/2022     Discussed health benefits of physical activity, and encouraged her to engage in regular exercise appropriate for her age and condition.    1. Annual physical exam   2. Breast cancer screening Given [facility] contact info. - MM Digital Screening; Future  3. High risk sexual behavior Test as below. Genital lesion does not appear vesicular or like Herpes. Suspect it was a Carbuncle that autodrained. Counseled patient to continue warm soaks and if no resolution will refer to gyn.  - Pap IG, CT/NG NAA, and HPV (high risk) - HIV antibody (with reflex) - RPR  4. Intermittent asthma, unspecified asthma severity, unspecified whether complicated Refilled. - albuterol (PROVENTIL HFA) 108 (90 Base) MCG/ACT inhaler; Inhale 2 puffs into the lungs every 4 (four) hours as needed for wheezing or shortness of breath.  Dispense: 6.7 g; Refill: 1  5. Adult hypothyroidism Counseled patient on med compliance.  - Comprehensive metabolic panel - TSH - levothyroxine (SYNTHROID, LEVOTHROID) 88 MCG tablet; Take 1 tablet (88 mcg total) by mouth daily.  Dispense: 90 tablet; Refill: 1  6. Recurrent major depressive disorder, remission status unspecified  ([place]) Patient does not want to change meds. Counseled on appropriate doses of Wellbutrin. Thinks she will feel better after she addresses anxiety of genital lesion.  - buPROPion (WELLBUTRIN SR) 150  MG 12 hr tablet; Take 1 tablet (150 mg total) by mouth 2 (two) times daily.  Dispense: 60 tablet; Refill: 1  7. Weight loss Do think this is tied to mood and appetite change.  - Lipid panel  8. Anemia, unspecified type  Labs as below.  - CBC with Differential/Platelet  Return in about 1 year (around 09/16/2017) for CPE.  Patient Instructions  Health Maintenance, Female Introduction Adopting a healthy lifestyle and getting preventive care can go a long way to promote health and wellness. Talk with your health care provider about what schedule of regular examinations is right for you. This is a good chance for you to check in with your provider about disease prevention and staying healthy. In between checkups, there are plenty of things you can do on your own. Experts have done a lot of research about which lifestyle changes and preventive measures are most likely to keep you healthy. Ask your health care provider for more information. Weight and diet Eat a healthy diet  Be sure to include plenty of vegetables, fruits, low-fat dairy products, and lean protein.  Do not eat a lot of foods high in solid fats, added sugars, or salt.  Get regular exercise. This is one of the most important things you can do for your health.  Most adults should exercise for at least 150 minutes each week. The exercise should increase your heart rate and make you sweat (moderate-intensity exercise).  Most adults should also do strengthening exercises at least twice a week. This is in addition to the moderate-intensity exercise. Maintain a healthy weight  Body mass index (BMI) is a measurement that can be used to identify possible weight problems. It estimates body fat based on height and weight. Your health care  provider can help determine your BMI and help you achieve or maintain a healthy weight.  For females 67 years of age and older:  A BMI below 18.5 is considered underweight.  A BMI of 18.5 to 24.9 is normal.  A BMI of 25 to 29.9 is considered overweight.  A BMI of 30 and above is considered obese. Watch levels of cholesterol and blood lipids  You should start having your blood tested for lipids and cholesterol at 41 years of age, then have this test every 5 years.  You may need to have your cholesterol levels checked more often if:  Your lipid or cholesterol levels are high.  You are older than 41 years of age.  You are at high risk for heart disease. Cancer screening Lung Cancer  Lung cancer screening is recommended for adults 41-75 years old who are at high risk for lung cancer because of a history of smoking.  A yearly low-dose CT scan of the lungs is recommended for people who:  Currently smoke.  Have quit within the past 15 years.  Have at least a 30-pack-year history of smoking. A pack year is smoking an average of one pack of cigarettes a day for 1 year.  Yearly screening should continue until it has been 15 years since you quit.  Yearly screening should stop if you develop a health problem that would prevent you from having lung cancer treatment. Breast Cancer  Practice breast self-awareness. This means understanding how your breasts normally appear and feel.  It also means doing regular breast self-exams. Let your health care provider know about any changes, no matter how small.  If you are in your 20s or 30s, you should have a clinical  breast exam (CBE) by a health care provider every 1-3 years as part of a regular health exam.  If you are 90 or older, have a CBE every year. Also consider having a breast X-ray (mammogram) every year.  If you have a family history of breast cancer, talk to your health care provider about genetic screening.  If you are at high  risk for breast cancer, talk to your health care provider about having an MRI and a mammogram every year.  Breast cancer gene (BRCA) assessment is recommended for women who have family members with BRCA-related cancers. BRCA-related cancers include:  Breast.  Ovarian.  Tubal.  Peritoneal cancers.  Results of the assessment will determine the need for genetic counseling and BRCA1 and BRCA2 testing. Cervical Cancer  Your health care provider may recommend that you be screened regularly for cancer of the pelvic organs (ovaries, uterus, and vagina). This screening involves a pelvic examination, including checking for microscopic changes to the surface of your cervix (Pap test). You may be encouraged to have this screening done every 3 years, beginning at age 74.  For women ages 4-65, health care providers may recommend pelvic exams and Pap testing every 3 years, or they may recommend the Pap and pelvic exam, combined with testing for human papilloma virus (HPV), every 5 years. Some types of HPV increase your risk of cervical cancer. Testing for HPV may also be done on women of any age with unclear Pap test results.  Other health care providers may not recommend any screening for nonpregnant women who are considered low risk for pelvic cancer and who do not have symptoms. Ask your health care provider if a screening pelvic exam is right for you.  If you have had past treatment for cervical cancer or a condition that could lead to cancer, you need Pap tests and screening for cancer for at least 20 years after your treatment. If Pap tests have been discontinued, your risk factors (such as having a new sexual partner) need to be reassessed to determine if screening should resume. Some women have medical problems that increase the chance of getting cervical cancer. In these cases, your health care provider may recommend more frequent screening and Pap tests. Colorectal Cancer  This type of cancer can  be detected and often prevented.  Routine colorectal cancer screening usually begins at 41 years of age and continues through 41 years of age.  Your health care provider may recommend screening at an earlier age if you have risk factors for colon cancer.  Your health care provider may also recommend using home test kits to check for hidden blood in the stool.  A small camera at the end of a tube can be used to examine your colon directly (sigmoidoscopy or colonoscopy). This is done to check for the earliest forms of colorectal cancer.  Routine screening usually begins at age 38.  Direct examination of the colon should be repeated every 5-10 years through 41 years of age. However, you may need to be screened more often if early forms of precancerous polyps or small growths are found. Skin Cancer  Check your skin from head to toe regularly.  Tell your health care provider about any new moles or changes in moles, especially if there is a change in a mole's shape or color.  Also tell your health care provider if you have a mole that is larger than the size of a pencil eraser.  Always use sunscreen. Apply sunscreen liberally  and repeatedly throughout the day.  Protect yourself by wearing long sleeves, pants, a wide-brimmed hat, and sunglasses whenever you are outside. Heart disease, diabetes, and high blood pressure  High blood pressure causes heart disease and increases the risk of stroke. High blood pressure is more likely to develop in:  People who have blood pressure in the high end of the normal range (130-139/85-89 mm Hg).  People who are overweight or obese.  People who are African American.  If you are 51-46 years of age, have your blood pressure checked every 3-5 years. If you are 13 years of age or older, have your blood pressure checked every year. You should have your blood pressure measured twice-once when you are at a hospital or clinic, and once when you are not at a  hospital or clinic. Record the average of the two measurements. To check your blood pressure when you are not at a hospital or clinic, you can use:  An automated blood pressure machine at a pharmacy.  A home blood pressure monitor.  If you are between 70 years and 8 years old, ask your health care provider if you should take aspirin to prevent strokes.  Have regular diabetes screenings. This involves taking a blood sample to check your fasting blood sugar level.  If you are at a normal weight and have a low risk for diabetes, have this test once every three years after 41 years of age.  If you are overweight and have a high risk for diabetes, consider being tested at a younger age or more often. Preventing infection Hepatitis B  If you have a higher risk for hepatitis B, you should be screened for this virus. You are considered at high risk for hepatitis B if:  You were born in a country where hepatitis B is common. Ask your health care provider which countries are considered high risk.  Your parents were born in a high-risk country, and you have not been immunized against hepatitis B (hepatitis B vaccine).  You have HIV or AIDS.  You use needles to inject street drugs.  You live with someone who has hepatitis B.  You have had sex with someone who has hepatitis B.  You get hemodialysis treatment.  You take certain medicines for conditions, including cancer, organ transplantation, and autoimmune conditions. Hepatitis C  Blood testing is recommended for:  Everyone born from [birth year] through 1965.  Anyone with known risk factors for hepatitis C. Sexually transmitted infections (STIs)  You should be screened for sexually transmitted infections (STIs) including gonorrhea and chlamydia if:  You are sexually active and are younger than 41 years of age.  You are older than 41 years of age and your health care provider tells you that you are at risk for this type of  infection.  Your sexual activity has changed since you were last screened and you are at an increased risk for chlamydia or gonorrhea. Ask your health care provider if you are at risk.  If you do not have HIV, but are at risk, it may be recommended that you take a prescription medicine daily to prevent HIV infection. This is called pre-exposure prophylaxis (PrEP). You are considered at risk if:  You are sexually active and do not regularly use condoms or know the HIV status of your partner(s).  You take drugs by injection.  You are sexually active with a partner who has HIV. Talk with your health care provider about whether you are at high  risk of being infected with HIV. If you choose to begin PrEP, you should first be tested for HIV. You should then be tested every 3 months for as long as you are taking PrEP. Pregnancy  If you are premenopausal and you may become pregnant, ask your health care provider about preconception counseling.  If you may become pregnant, take 400 to 800 micrograms (mcg) of folic acid every day.  If you want to prevent pregnancy, talk to your health care provider about birth control (contraception). Osteoporosis and menopause  Osteoporosis is a disease in which the bones lose minerals and strength with aging. This can result in serious bone fractures. Your risk for osteoporosis can be identified using a bone density scan.  If you are 58 years of age or older, or if you are at risk for osteoporosis and fractures, ask your health care provider if you should be screened.  Ask your health care provider whether you should take a calcium or vitamin D supplement to lower your risk for osteoporosis.  Menopause may have certain physical symptoms and risks.  Hormone replacement therapy may reduce some of these symptoms and risks. Talk to your health care provider about whether hormone replacement therapy is right for you. Follow these instructions at home:  Schedule  regular health, dental, and eye exams.  Stay current with your immunizations.  Do not use any tobacco products including cigarettes, chewing tobacco, or electronic cigarettes.  If you are pregnant, do not drink alcohol.  If you are breastfeeding, limit how much and how often you drink alcohol.  Limit alcohol intake to no more than 1 drink per day for nonpregnant women. One drink equals 12 ounces of beer, 5 ounces of wine, or 1 ounces of hard liquor.  Do not use street drugs.  Do not share needles.  Ask your health care provider for help if you need support or information about quitting drugs.  Tell your health care provider if you often feel depressed.  Tell your health care provider if you have ever been abused or do not feel safe at home. This information is not intended to replace advice given to you by your health care provider. Make sure you discuss any questions you have with your health care provider. Document Released: 03/03/2011 Document Revised: 01/24/2016 Document Reviewed: 05/22/2015  2017 Elsevier   The entirety of the information documented in the History of Present Illness, Review of Systems and Physical Exam were personally obtained by me. Portions of this information were initially documented by Ashley Royalty, CMA and reviewed by me for thoroughness and accuracy.   --------------------------------------------------------------------    Trinna Post, PA-C  [facility]

## 2016-09-16 NOTE — Patient Instructions (Signed)

## 2016-09-22 ENCOUNTER — Telehealth: Payer: Self-pay

## 2016-09-22 LAB — PAP IG, CT-NG NAA, HPV HIGH-RISK
Chlamydia, Nuc. Acid Amp: NEGATIVE
Gonococcus by Nucleic Acid Amp: NEGATIVE
HPV, high-risk: NEGATIVE
PAP Smear Comment: 0

## 2016-09-22 NOTE — Telephone Encounter (Signed)
Pt advised. Pt reports she has labs done today. Renaldo Fiddler, CMA

## 2016-09-22 NOTE — Telephone Encounter (Signed)
-----   Message from Trinna Post, [place] sent at 09/22/2016  1:40 PM EST ----- Gonorrhea and chlamydia were negative. Pap sample was insufficient for evaluation of abnormality/HPV. Patient will need to come in for resample, this will be no charge. I have noticed she hasn't gotten her labs yet so if she wants to do them together, this is fine. I do apologize for the inconvenience.

## 2016-09-23 LAB — COMPREHENSIVE METABOLIC PANEL
ALT: 10 IU/L (ref 0–32)
AST: 13 IU/L (ref 0–40)
Albumin/Globulin Ratio: 1.4 (ref 1.2–2.2)
Albumin: 4 g/dL (ref 3.5–5.5)
Alkaline Phosphatase: 68 IU/L (ref 39–117)
BUN/Creatinine Ratio: 11 (ref 9–23)
BUN: 10 mg/dL (ref 6–24)
Bilirubin Total: <0.2 mg/dL (ref 0.0–1.2)
CO2: 23 mmol/L (ref 18–29)
Calcium: 9 mg/dL (ref 8.7–10.2)
Chloride: 107 mmol/L — ABNORMAL HIGH (ref 96–106)
Creatinine, Ser: 0.9 mg/dL (ref 0.57–1.00)
GFR calc Af Amer: 92 mL/min/{1.73_m2} (ref >59)
GFR calc non Af Amer: 80 mL/min/{1.73_m2} (ref >59)
Globulin, Total: 2.8 g/dL (ref 1.5–4.5)
Glucose: 89 mg/dL (ref 65–99)
Potassium: 4.1 mmol/L (ref 3.5–5.2)
Sodium: 142 mmol/L (ref 134–144)
Total Protein: 6.8 g/dL (ref 6.0–8.5)

## 2016-09-23 LAB — CBC WITH DIFFERENTIAL/PLATELET
Basophils Absolute: 0 10*3/uL (ref 0.0–0.2)
Basos: 1 %
EOS (ABSOLUTE): 0.2 10*3/uL (ref 0.0–0.4)
Eos: 4 %
Hematocrit: 40.2 % (ref 34.0–46.6)
Hemoglobin: 13.4 g/dL (ref 11.1–15.9)
Immature Grans (Abs): 0 10*3/uL (ref 0.0–0.1)
Immature Granulocytes: 0 %
Lymphocytes Absolute: 2.3 10*3/uL (ref 0.7–3.1)
Lymphs: 56 %
MCH: 28.6 pg (ref 26.6–33.0)
MCHC: 33.3 g/dL (ref 31.5–35.7)
MCV: 86 fL (ref 79–97)
Monocytes Absolute: 0.5 10*3/uL (ref 0.1–0.9)
Monocytes: 12 %
Neutrophils Absolute: 1.1 10*3/uL — ABNORMAL LOW (ref 1.4–7.0)
Neutrophils: 27 %
Platelets: 217 10*3/uL (ref 150–379)
RBC: 4.69 x10E6/uL (ref 3.77–5.28)
RDW: 13.5 % (ref 12.3–15.4)
WBC: 4.1 10*3/uL (ref 3.4–10.8)

## 2016-09-23 LAB — LIPID PANEL
Chol/HDL Ratio: 2.7 ratio units (ref 0.0–4.4)
Cholesterol, Total: 156 mg/dL (ref 100–199)
HDL: 57 mg/dL (ref >39)
LDL Calculated: 85 mg/dL (ref 0–99)
Triglycerides: 68 mg/dL (ref 0–149)
VLDL Cholesterol Cal: 14 mg/dL (ref 5–40)

## 2016-09-23 LAB — TSH: TSH: 2.94 u[IU]/mL (ref 0.450–4.500)

## 2016-09-23 LAB — HIV ANTIBODY (ROUTINE TESTING W REFLEX): HIV Screen 4th Generation wRfx: NONREACTIVE

## 2016-09-23 LAB — RPR: RPR Ser Ql: NONREACTIVE

## 2016-09-24 ENCOUNTER — Telehealth: Payer: Self-pay

## 2016-09-24 NOTE — Telephone Encounter (Signed)
Tried calling patient, and no answer. Unable to leave VM due to mailbox being full. Will try again later.

## 2016-09-24 NOTE — Telephone Encounter (Signed)
-----   Message from Trinna Post, [place] sent at 09/24/2016  8:27 AM EST ----- Tests for HIV and syphilis were negative. CBC, CMET, lipid and tsh all normal.

## 2016-09-26 NOTE — Telephone Encounter (Signed)
Pt advised-aa 

## 2016-09-30 ENCOUNTER — Ambulatory Visit: Payer: Self-pay | Admitting: Physician Assistant

## 2016-10-03 ENCOUNTER — Other Ambulatory Visit: Payer: Self-pay | Admitting: Physician Assistant

## 2016-10-03 DIAGNOSIS — J45909 Unspecified asthma, uncomplicated: Secondary | ICD-10-CM

## 2016-10-03 MED ORDER — ALBUTEROL SULFATE HFA 108 (90 BASE) MCG/ACT IN AERS: 2.0000 | INHALATION_SPRAY | Freq: Four times a day (QID) | RESPIRATORY_TRACT | 2 refills | Status: DC | PRN

## 2016-10-03 NOTE — Progress Notes (Unsigned)
Sent in [employer] to pharmacy as insurance didn't cover proventil.

## 2016-10-03 NOTE — Progress Notes (Signed)
Pt advised-aa 

## 2017-02-06 ENCOUNTER — Ambulatory Visit (INDEPENDENT_AMBULATORY_CARE_PROVIDER_SITE_OTHER): Payer: Managed Care, Other (non HMO) | Admitting: Physician Assistant

## 2017-02-06 ENCOUNTER — Encounter: Payer: Self-pay | Admitting: Physician Assistant

## 2017-02-06 VITALS — BP 138/86 | HR 88 | Temp 98.4°F | Resp 16 | Wt 129.0 lb

## 2017-02-06 DIAGNOSIS — F339 Major depressive disorder, recurrent, unspecified: Secondary | ICD-10-CM | POA: Diagnosis not present

## 2017-02-06 MED ORDER — MIRTAZAPINE 15 MG PO TABS: 15.0000 mg | ORAL_TABLET | Freq: Every day | ORAL | 0 refills | Status: DC

## 2017-02-06 NOTE — Progress Notes (Signed)
Patient: Ebony Steele Female    DOB: [birth year]   41 y.o.   MRN: [id number] Visit Date: 02/06/2017  Today's Provider: Trinna Post, PA-C   Chief Complaint  Patient presents with  . Depression    Follow up   Subjective:      Ebony Steele is a 41 y/o woman with history of depression presenting today for worsening depression. She stopped her Wellbutrin in March due to intolerable side effects including jitteriness. She has been worsening since then. She still see Ebony Steele, her therapist, regularly. She just saw her yesterday and she recommended she come in to start a different medication.   She has a lot of things in her life right now. She has had to pick up one of her daughters from [place] because she had been dismissed from the dorms. Her youngest daughter is living with the patient's ex-husband and the daughter's father in [place]. They have a difficult relationship and she reports he is taking legal action to keep custody of this daughter. The patient works as a Merchandiser, retail and feels she always has to be "on" for her job and when she gets home she just crashes. She feels tired, has crying spells, is unable to sleep 2/2 racing thoughts. She has no appetite and has lost 15 pounds since she was last here. She was using marijuana to help with her anxiety and appetite, and it was helping. However she was drug tested by the nursing board and got in trouble. She reports today that she is not using marijuana right now and has not for several weeks. She reports having suicidal thoughts, like everyone would be better off without her, but no plans.   Depression         This is a chronic problem.  The problem has been gradually worsening (Especially since March.  Pt reports she D/C'd Wellbutrin secondary to side effects.  ) since onset.  Associated symptoms include decreased concentration, fatigue, helplessness, hopelessness, insomnia, irritable, restlessness, decreased interest, appetite change  (Pt reports not having an appetite. ), sad and suicidal ideas (Pt says she has thought about it but does not have a plan.  ).  Associated symptoms include no body aches, no myalgias, no headaches and no indigestion.      Allergies  Allergen Reactions  . Acetaminophen      Current Outpatient Prescriptions:  .  albuterol (PROAIR HFA) 108 (90 Base) MCG/ACT inhaler, Inhale 2 puffs into the lungs every 6 (six) hours as needed for wheezing or shortness of breath., Disp: 6.7 g, Rfl: 2 .  cetirizine (ZYRTEC ALLERGY) 10 MG tablet, Take by mouth., Disp: , Rfl:  .  levothyroxine (SYNTHROID, LEVOTHROID) 88 MCG tablet, Take 1 tablet (88 mcg total) by mouth daily., Disp: 90 tablet, Rfl: 1 .  ondansetron (ZOFRAN) 4 MG tablet, Take 4 mg by mouth every 8 (eight) hours as needed for nausea or vomiting., Disp: , Rfl:  .  buPROPion (WELLBUTRIN SR) 150 MG 12 hr tablet, Take 1 tablet (150 mg total) by mouth 2 (two) times daily. (Patient not taking: Reported on 02/06/2017), Disp: 60 tablet, Rfl: 1  Review of Systems  Constitutional: Positive for appetite change (Pt reports not having an appetite. ) and fatigue. Negative for activity change, chills, diaphoresis, fever and unexpected weight change.  Respiratory: Negative.   Cardiovascular: Negative.   Gastrointestinal: Positive for abdominal pain (Cramping) and nausea. Negative for abdominal distention, anal bleeding, blood in stool, constipation,  diarrhea, rectal pain and vomiting.  Musculoskeletal: Negative.  Negative for myalgias.  Neurological: Negative for dizziness, light-headedness and headaches.  Psychiatric/Behavioral: Positive for decreased concentration, depression, sleep disturbance and suicidal ideas (Pt says she has thought about it but does not have a plan.  ). Negative for agitation, behavioral problems, confusion, dysphoric mood, hallucinations and self-injury. The patient is nervous/anxious and has insomnia. The patient is not hyperactive.      Social History  Substance Use Topics  . Smoking status: Current Some Day Smoker    Types: Cigars  . Smokeless tobacco: Never Used     Comment: Smoked 1 cigar per day  . Alcohol use Yes     Comment: Occasional Wine   Objective:   BP 138/86 (BP Location: Left Arm, Patient Position: Sitting, Cuff Size: Normal)   Steele 88   Temp 98.4 F (36.9 C) (Oral)   Resp 16   Wt 129 lb (58.5 kg)   LMP 01/06/2017   BMI 22.14 kg/m  Vitals:   02/06/17 1010  BP: 138/86  Steele: 88  Resp: 16  Temp: 98.4 F (36.9 C)  TempSrc: Oral  Weight: 129 lb (58.5 kg)     Physical Exam  Constitutional: She is oriented to person, place, and time. She appears well-developed and well-nourished. She is irritable.  Cardiovascular: Normal rate and regular rhythm.   Pulmonary/Chest: Effort normal.  Neurological: She is alert and oriented to person, place, and time.  Skin: Skin is warm and dry.  Psychiatric: Her behavior is normal. She exhibits a depressed mood.  Tearful in office today.         Assessment & Plan:     1. Episode of recurrent major depressive disorder, unspecified depression episode severity ([place])  Worsening depression. Will try Remeron, to be taken at night, for sedative effect and hopefully to increase appetite. Follow up in one month. Counseled to call this office, hotline, a friend, or 911 if she is having suicidal thoughts/plans. Patient agrees.  - mirtazapine (REMERON) 15 MG tablet; Take 1 tablet (15 mg total) by mouth at bedtime.  Dispense: 90 tablet; Refill: 0  Return in about 1 month (around 03/08/2017).  The entirety of the information documented in the History of Present Illness, Review of Systems and Physical Exam were personally obtained by me. Portions of this information were initially documented by Ashley Royalty, CMA and reviewed by me for thoroughness and accuracy.        Trinna Post, PA-C  [facility]

## 2017-03-06 ENCOUNTER — Ambulatory Visit: Payer: Managed Care, Other (non HMO) | Admitting: Physician Assistant

## 2017-03-17 ENCOUNTER — Telehealth: Payer: Self-pay | Admitting: Physician Assistant

## 2017-03-17 ENCOUNTER — Encounter: Payer: Self-pay | Admitting: Physician Assistant

## 2017-03-17 ENCOUNTER — Ambulatory Visit (INDEPENDENT_AMBULATORY_CARE_PROVIDER_SITE_OTHER): Payer: Managed Care, Other (non HMO) | Admitting: Physician Assistant

## 2017-03-17 VITALS — BP 122/78 | HR 95 | Resp 16 | Wt 135.0 lb

## 2017-03-17 DIAGNOSIS — F339 Major depressive disorder, recurrent, unspecified: Secondary | ICD-10-CM

## 2017-03-17 DIAGNOSIS — F419 Anxiety disorder, unspecified: Secondary | ICD-10-CM | POA: Diagnosis not present

## 2017-03-17 DIAGNOSIS — Z124 Encounter for screening for malignant neoplasm of cervix: Secondary | ICD-10-CM | POA: Diagnosis not present

## 2017-03-17 MED ORDER — PAROXETINE HCL 20 MG PO TABS: 20.0000 mg | ORAL_TABLET | Freq: Every day | ORAL | 2 refills | Status: DC

## 2017-03-17 MED ORDER — ALPRAZOLAM 0.25 MG PO TABS: 0.2500 mg | ORAL_TABLET | Freq: Every day | ORAL | 0 refills | Status: DC | PRN

## 2017-03-17 NOTE — Telephone Encounter (Signed)
[place] Board of nursing form filled out and placed for fax.

## 2017-03-17 NOTE — Patient Instructions (Signed)

## 2017-03-17 NOTE — Progress Notes (Signed)
Patient: Ebony Steele Female    DOB: 09-07-1975   41 y.o.   MRN: [id number] Visit Date: 03/17/2017  Today's Provider: Trinna Post, PA-C   Chief Complaint  Patient presents with  . Depression  . Gynecologic Exam   Subjective:      Ebony Steele is a 41 y/o woman presenting today for follow up of depression and insufficient PAP smear. Last visit she was started on 15 mg Remeron. She had experienced fatigue with this and so cut it in half, which worsened. She has gained 6 lbs since last visit. Not thinking of self harming. She reports continued somnolence and also constipation. Would like to change medications. Continues to have some moments of extreme anxiety, palms sweating.   Last PAP smear insufficient, repeat today.   Depression         This is a chronic problem.  The problem has been gradually improving (Pt reports about a 50% improvement since starting Mirtazapine.) since onset.  Associated symptoms include decreased concentration, fatigue and decreased interest.  Associated symptoms include no helplessness, no hopelessness, does not have insomnia, no restlessness, no appetite change, no body aches, no myalgias, no headaches, no indigestion, not sad and no suicidal ideas.  Compliance with treatment is variable (Pt reports the medication is causing too many side effects such as sleepiness, "feeling foggy during the day". ).      Allergies  Allergen Reactions  . Acetaminophen      Current Outpatient Prescriptions:  .  albuterol (PROAIR HFA) 108 (90 Base) MCG/ACT inhaler, Inhale 2 puffs into the lungs every 6 (six) hours as needed for wheezing or shortness of breath., Disp: 6.7 g, Rfl: 2 .  cetirizine (ZYRTEC ALLERGY) 10 MG tablet, Take by mouth., Disp: , Rfl:  .  levothyroxine (SYNTHROID, LEVOTHROID) 88 MCG tablet, Take 1 tablet (88 mcg total) by mouth daily., Disp: 90 tablet, Rfl: 1 .  mirtazapine (REMERON) 15 MG tablet, Take 1 tablet (15 mg total) by mouth at  bedtime., Disp: 90 tablet, Rfl: 0 .  ondansetron (ZOFRAN) 4 MG tablet, Take 4 mg by mouth every 8 (eight) hours as needed for nausea or vomiting., Disp: , Rfl:   Review of Systems  Constitutional: Positive for activity change and fatigue. Negative for appetite change, chills, diaphoresis, fever and unexpected weight change.  Respiratory: Negative.   Gastrointestinal: Positive for constipation. Negative for abdominal distention, abdominal pain, anal bleeding, blood in stool, diarrhea, nausea, rectal pain and vomiting.  Musculoskeletal: Negative.  Negative for myalgias.  Neurological: Negative for dizziness, light-headedness and headaches.  Psychiatric/Behavioral: Positive for decreased concentration and depression. Negative for agitation, behavioral problems, confusion, hallucinations, self-injury, sleep disturbance and suicidal ideas. The patient is nervous/anxious. The patient does not have insomnia and is not hyperactive.     Social History  Substance Use Topics  . Smoking status: Current Some Day Smoker    Types: Cigars  . Smokeless tobacco: Never Used     Comment: Smoked 1 cigar per day  . Alcohol use Yes     Comment: Occasional Wine   Objective:   BP 122/78 (BP Location: Left Arm, Patient Position: Sitting, Cuff Size: Normal)   Pulse 95   Resp 16   Wt 135 lb (61.2 kg)   LMP 03/10/2017   SpO2 98%   BMI 23.17 kg/m  Vitals:   03/17/17 0817  BP: 122/78  Pulse: 95  Resp: 16  SpO2: 98%  Weight: 135 lb (61.2 kg)  Physical Exam  Constitutional: She is oriented to person, place, and time. She appears well-developed and well-nourished.  Cardiovascular: Normal rate.   Pulmonary/Chest: Effort normal.  Genitourinary: Cervix exhibits no discharge and no friability.  Neurological: She is alert and oriented to person, place, and time.  Skin: Skin is warm and dry.  Psychiatric: Her behavior is normal. She exhibits a depressed mood.        Assessment & Plan:     1. Episode  of recurrent major depressive disorder, unspecified depression episode severity ([place])  Explained to patient that sedation is worse at lower doses with Remeron, so to lessen this effect we would have to increase it. Constipation side effects are intolerable to patient, would like to switch. Would like to try Paxil.   - PARoxetine (PAXIL) 20 MG tablet; Take 1 tablet (20 mg total) by mouth daily.  Dispense: 30 tablet; Refill: 2  2. Anxiety  Counseled on use. No driving, drinking, use of pain meds with this. Explained sedation and addiction risk. Use for truly unbearable moments.   - ALPRAZolam (XANAX) 0.25 MG tablet; Take 1 tablet (0.25 mg total) by mouth daily as needed for anxiety.  Dispense: 30 tablet; Refill: 0  3. Cervical cancer screening  - Pap IG and HPV (high risk) DNA detection  Return in about 1 month (around 04/17/2017).  The entirety of the information documented in the History of Present Illness, Review of Systems and Physical Exam were personally obtained by me. Portions of this information were initially documented by Ashley Royalty, CMA and reviewed by me for thoroughness and accuracy.        Trinna Post, PA-C  [facility]

## 2017-03-20 LAB — PAP IG AND HPV HIGH-RISK
HPV, high-risk: NEGATIVE
PAP Smear Comment: 0

## 2017-04-21 ENCOUNTER — Encounter: Payer: Self-pay | Admitting: Physician Assistant

## 2017-04-21 ENCOUNTER — Ambulatory Visit (INDEPENDENT_AMBULATORY_CARE_PROVIDER_SITE_OTHER): Payer: Managed Care, Other (non HMO) | Admitting: Physician Assistant

## 2017-04-21 VITALS — BP 138/86 | HR 84 | Temp 98.5°F | Resp 16 | Wt 133.0 lb

## 2017-04-21 DIAGNOSIS — F339 Major depressive disorder, recurrent, unspecified: Secondary | ICD-10-CM | POA: Diagnosis not present

## 2017-04-21 DIAGNOSIS — F419 Anxiety disorder, unspecified: Secondary | ICD-10-CM

## 2017-04-21 MED ORDER — PAROXETINE HCL 30 MG PO TABS: 30.0000 mg | ORAL_TABLET | Freq: Every day | ORAL | 2 refills | Status: DC

## 2017-04-21 NOTE — Patient Instructions (Signed)

## 2017-04-21 NOTE — Progress Notes (Signed)
Patient: Ebony Steele Female    DOB: Dec 09, 1975   41 y.o.   MRN: [id number] Visit Date: 04/21/2017  Today's Provider: Trinna Post, PA-C   Chief Complaint  Patient presents with  . Depression    Four week follow up   Subjective:    Pt comes in today for a four week follow up on Depression and Anxiety.  She started on Paxil 20mg  daily.  Pt reports she is tolerated the Paxil well with no side effects.  She states only mild improvement with depression, but anxiety seems better she only needed to take Xanax once in the last four weeks.     Depression         This is a chronic problem.  The problem has been gradually improving since onset.  Associated symptoms include decreased concentration, fatigue, insomnia, restlessness and decreased interest.  Associated symptoms include no helplessness, no hopelessness, no appetite change, no myalgias, no headaches, no indigestion, not sad and no suicidal ideas.  Compliance with treatment is good.  Has been tolerating medication well. No adverse effects, no SI/HI. Struggling with family dynamics. Court decided her 98 y/o daughter could stay with her father in [place]. She is awaiting her boyfriend's release from prison in December, which has also been a source of stress for the patient.     Allergies  Allergen Reactions  . Acetaminophen      Current Outpatient Prescriptions:  .  albuterol (PROAIR HFA) 108 (90 Base) MCG/ACT inhaler, Inhale 2 puffs into the lungs every 6 (six) hours as needed for wheezing or shortness of breath., Disp: 6.7 g, Rfl: 2 .  ALPRAZolam (XANAX) 0.25 MG tablet, Take 1 tablet (0.25 mg total) by mouth daily as needed for anxiety., Disp: 30 tablet, Rfl: 0 .  cetirizine (ZYRTEC ALLERGY) 10 MG tablet, Take by mouth., Disp: , Rfl:  .  ondansetron (ZOFRAN) 4 MG tablet, Take 4 mg by mouth every 8 (eight) hours as needed for nausea or vomiting., Disp: , Rfl:  .  PARoxetine (PAXIL) 20 MG tablet, Take 1 tablet (20 mg  total) by mouth daily., Disp: 30 tablet, Rfl: 2 .  levothyroxine (SYNTHROID, LEVOTHROID) 88 MCG tablet, Take 1 tablet (88 mcg total) by mouth daily. (Patient not taking: Reported on 04/21/2017), Disp: 90 tablet, Rfl: 1  Review of Systems  Constitutional: Positive for fatigue. Negative for activity change, appetite change, chills, diaphoresis, fever and unexpected weight change.  Respiratory: Negative.   Cardiovascular: Negative.   Gastrointestinal: Positive for constipation. Negative for abdominal distention, abdominal pain, anal bleeding, blood in stool, diarrhea, nausea, rectal pain and vomiting.  Musculoskeletal: Negative for myalgias.  Neurological: Negative for dizziness, light-headedness and headaches.  Psychiatric/Behavioral: Positive for decreased concentration, depression and sleep disturbance. Negative for agitation, behavioral problems, confusion, dysphoric mood, hallucinations, self-injury and suicidal ideas. The patient has insomnia. The patient is not nervous/anxious and is not hyperactive.     Social History  Substance Use Topics  . Smoking status: Current Some Day Smoker    Types: Cigars  . Smokeless tobacco: Never Used     Comment: Smoked 1 cigar per day  . Alcohol use Yes     Comment: Occasional Wine   Objective:   BP 138/86 (BP Location: Left Arm, Patient Position: Sitting, Cuff Size: Normal)   Pulse 84   Temp 98.5 F (36.9 C) (Oral)   Resp 16   Wt 133 lb (60.3 kg)   LMP 03/30/2017   BMI  22.83 kg/m  Vitals:   04/21/17 0839  BP: 138/86  Pulse: 84  Resp: 16  Temp: 98.5 F (36.9 C)  TempSrc: Oral  Weight: 133 lb (60.3 kg)     Physical Exam  Constitutional: She is oriented to person, place, and time. She appears well-developed and well-nourished.  Cardiovascular: Normal rate.   Pulmonary/Chest: Effort normal.  Neurological: She is alert and oriented to person, place, and time.  Skin: Skin is warm and dry.  Psychiatric: Her behavior is normal. She  exhibits a depressed mood.        Assessment & Plan:     1. Episode of recurrent major depressive disorder, unspecified depression episode severity ([place])  Will increase Paxil to 30 mg daily and follow up one month. Patient is still continuing counseling.  - PARoxetine (PAXIL) 30 MG tablet; Take 1 tablet (30 mg total) by mouth daily.  Dispense: 30 tablet; Refill: 2  2. Anxiety  - PARoxetine (PAXIL) 30 MG tablet; Take 1 tablet (30 mg total) by mouth daily.  Dispense: 30 tablet; Refill: 2  Return in about 1 month (around 05/22/2017) for depression medication.  The entirety of the information documented in the History of Present Illness, Review of Systems and Physical Exam were personally obtained by me. Portions of this information were initially documented by Ashley Royalty, CMA and reviewed by me for thoroughness and accuracy.        Trinna Post, PA-C  [facility]

## 2017-05-21 ENCOUNTER — Encounter: Payer: Self-pay | Admitting: Physician Assistant

## 2017-05-21 ENCOUNTER — Ambulatory Visit (INDEPENDENT_AMBULATORY_CARE_PROVIDER_SITE_OTHER): Payer: Managed Care, Other (non HMO) | Admitting: Physician Assistant

## 2017-05-21 VITALS — BP 130/90 | HR 98 | Temp 98.4°F | Resp 16 | Ht 64.0 in | Wt 136.0 lb

## 2017-05-21 DIAGNOSIS — G4700 Insomnia, unspecified: Secondary | ICD-10-CM

## 2017-05-21 DIAGNOSIS — F339 Major depressive disorder, recurrent, unspecified: Secondary | ICD-10-CM | POA: Diagnosis not present

## 2017-05-21 DIAGNOSIS — N938 Other specified abnormal uterine and vaginal bleeding: Secondary | ICD-10-CM

## 2017-05-21 MED ORDER — TRAZODONE HCL 50 MG PO TABS: 25.0000 mg | ORAL_TABLET | Freq: Every evening | ORAL | 3 refills | Status: DC | PRN

## 2017-05-21 NOTE — Patient Instructions (Signed)

## 2017-05-21 NOTE — Progress Notes (Signed)
Patient: Ebony Steele Female    DOB: 11/16/1975   41 y.o.   MRN: [id number] Visit Date: 05/21/2017  Today's Provider: Trinna Post, PA-C   Chief Complaint  Patient presents with  . Depression   Subjective:    HPI  Depression, Follow-up  She  was last seen for this 4 weeks ago. Changes made at last visit include incres Paxil to 30 mg.   She reports excellent compliance with treatment. She is not having side effects.   She reports excellent tolerance of treatment. Current symptoms include: depressed mood and difficulty concentrating She feels she is Improved since last visit. She is having difficulty with 54 year old daughter right now  She is having trouble sleeping. She has a history of insomnia. She has tried melatonin without success. Tried unisom without success. She has a script of trazadone prior from Dr. Venia Minks which she took and helped.  Dysfunctional Uterine Bleeding  Has been having heavier periods. She says she was seen by a gynecologist many year ago when she was 70 and was told she had adenomyosis. She was offered a hysterectomy at that point but felt it was too early. 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 This was performed during an ex-lap for endometriosis workup. However, her uterine bleeding is such to the extent that she feels ready to reconsider this option and would like to see gynecology. No dizziness, fatigue, or passing out.  ------------------------------------------------------------------------     Allergies  Allergen Reactions  . Acetaminophen      Current Outpatient Prescriptions:  .  albuterol (PROAIR HFA) 108 (90 Base) MCG/ACT inhaler, Inhale 2 puffs into the lungs every 6 (six) hours as needed for wheezing or shortness of breath., Disp: 6.7 g, Rfl: 2 .  ALPRAZolam (XANAX) 0.25 MG tablet, Take 1 tablet (0.25 mg total) by mouth daily as needed for anxiety., Disp: 30 tablet, Rfl: 0 .   cetirizine (ZYRTEC ALLERGY) 10 MG tablet, Take by mouth., Disp: , Rfl:  .  levothyroxine (SYNTHROID, LEVOTHROID) 88 MCG tablet, Take 1 tablet (88 mcg total) by mouth daily. (Patient not taking: Reported on 04/21/2017), Disp: 90 tablet, Rfl: 1 .  PARoxetine (PAXIL) 30 MG tablet, Take 1 tablet (30 mg total) by mouth daily., Disp: 30 tablet, Rfl: 2  Review of Systems  Constitutional: Negative.   Cardiovascular: Negative.   Psychiatric/Behavioral: Positive for agitation, decreased concentration and sleep disturbance.    Social History  Substance Use Topics  . Smoking status: Current Some Day Smoker    Types: Cigars  . Smokeless tobacco: Never Used     Comment: Smoked 1 cigar per day  . Alcohol use Yes     Comment: Occasional Wine   Objective:   BP (!) 160/100 (BP Location: Left Arm, Patient Position: Sitting, Cuff Size: Normal)   Pulse 98   Temp 98.4 F (36.9 C) (Oral)   Resp 16   Ht 5\' 4"  (1.626 m)   Wt 136 lb (61.7 kg)   SpO2 98%   BMI 23.34 kg/m  Vitals:   05/21/17 0814  BP: (!) 160/100  Pulse: 98  Resp: 16  Temp: 98.4 F (36.9 C)  TempSrc: Oral  SpO2: 98%  Weight: 136 lb (61.7 kg)  Height: 5\' 4"  (1.626 m)     Physical Exam  Constitutional: She is oriented to person, place, and time. She appears well-developed and well-nourished.  Cardiovascular: Normal rate  and regular rhythm.   Pulmonary/Chest: Effort normal and breath sounds normal.  Neurological: She is alert and oriented to person, place, and time.  Skin: Skin is warm and dry. No pallor.  Psychiatric: Her behavior is normal. She exhibits a depressed mood.        Assessment & Plan:     1. Episode of recurrent major depressive disorder, unspecified depression episode severity ([place])  Stable, still not great control. Does not wish to increase medication. She is able to function at work.   2. Insomnia, unspecified type  Please do not take this with Xanax. She has only used one tab of Xanax since last visit.    - traZODone (DESYREL) 50 MG tablet; Take 0.5-1 tablets (25-50 mg total) by mouth at bedtime as needed for sleep.  Dispense: 30 tablet; Refill: 3  3. Dysfunctional uterine bleeding  - Ambulatory referral to Obstetrics / Gynecology  Return in about 3 months (around 08/20/2017) for Depression .  The entirety of the information documented in the History of Present Illness, Review of Systems and Physical Exam were personally obtained by me. Portions of this information were initially documented by [facility], CMA and reviewed by me for thoroughness and accuracy.        Trinna Post, PA-C  [facility]

## 2017-05-25 ENCOUNTER — Ambulatory Visit (INDEPENDENT_AMBULATORY_CARE_PROVIDER_SITE_OTHER): Payer: Managed Care, Other (non HMO) | Admitting: Obstetrics and Gynecology

## 2017-05-25 ENCOUNTER — Encounter: Payer: Self-pay | Admitting: Obstetrics and Gynecology

## 2017-05-25 VITALS — BP 120/78 | HR 80 | Ht 64.0 in | Wt 139.0 lb

## 2017-05-25 DIAGNOSIS — N921 Excessive and frequent menstruation with irregular cycle: Secondary | ICD-10-CM | POA: Diagnosis not present

## 2017-05-25 DIAGNOSIS — N938 Other specified abnormal uterine and vaginal bleeding: Secondary | ICD-10-CM

## 2017-05-25 NOTE — Progress Notes (Signed)
Chief Complaint  Patient presents with  . Menorrhagia    HPI:      [name] is a 41 y.o. G5P0010 who LMP was Patient's last menstrual period was 05/25/2017., presents today for NP eval of menometrorrhagia and DUB for the past 10-12 months. Referred by PCP Terrilee Croak, Wendee Beavers, PA-C). Pt's menses used to be Q4 wks, but are now Q2-5 wks, last 4-7 days, with med heavy flow. She also has clots on her heavier days. Pt's dysmen is worse as well, sometimes missing work due to pain. Sx improved with ibup 800 mg.  She had normal thyroid and CBC labs 1/8. She states Dr. Ammie Dalton told her she had adenomyosis at age 64. Pt's menses improved with OCPs at that time so didn't have hyst. 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  She is sex active, using BTL. Occas pain with sex but nothing unusual for pt. No postcoital bleeding. Pt does have occas random pelvic pain. She has a long hx of constipation. No fevers.   Pt uses tobacco occas. She has menstrual migraines with aura.   Neg pap/neg HPV DNA 7/18.  Past Medical History:  Diagnosis Date  . Asthma   . Hypertension   . Thyroid disease     Past Surgical History:  Procedure Laterality Date  . DILATION AND CURETTAGE OF UTERUS  2005  . LAPAROSCOPY  2000  . OOPHORECTOMY Right 2000   Tertoma  . TUBAL LIGATION      Family History  Problem Relation Age of Onset  . Diabetes Mother   . Depression Mother   . Hypertension Mother   . Glaucoma Mother   . Kidney disease Father   . Diabetes Father   . Diabetes Mellitus I Brother     Social History   Social History  . Marital status: Divorced    Spouse name: N/A  . Number of children: 3  . Years of education: College   Occupational History  . 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 Smoking status: Current Some Day Smoker    Types: Cigars  . Smokeless tobacco: Never Used   Comment: Smoked 1 cigar per day  . Alcohol use Yes     Comment: Occasional Wine  . Drug use: No     Comment: Pt reports using Marijuana to help with depression symptoms.  She stopped  the end of May 2018  . Sexual activity: Not on file   Other Topics Concern  . Not on file   Social History Narrative  . No narrative on file     Current Outpatient Prescriptions:  .  albuterol (PROAIR HFA) 108 (90 Base) MCG/ACT inhaler, Inhale 2 puffs into the lungs every 6 (six) hours as needed for wheezing or shortness of breath., Disp: 6.7 g, Rfl: 2 .  ALPRAZolam (XANAX) 0.25 MG tablet, Take 1 tablet (0.25 mg total) by mouth daily as needed for anxiety., Disp: 30 tablet, Rfl: 0 .  cetirizine (ZYRTEC ALLERGY) 10 MG tablet, Take by mouth., Disp: , Rfl:  .  levothyroxine (SYNTHROID, LEVOTHROID) 88 MCG tablet, Take 1 tablet (88 mcg total) by mouth daily., Disp: 90 tablet, Rfl: 1 .  PARoxetine (PAXIL) 30 MG tablet, Take 1 tablet (30 mg total) by mouth daily., Disp: 30 tablet, Rfl: 2 .  traZODone (DESYREL) 50 MG tablet, Take 0.5-1  tablets (25-50 mg total) by mouth at bedtime as needed for sleep., Disp: 30 tablet, Rfl: 3   ROS:  Review of Systems  Constitutional: Negative for fever.  Gastrointestinal: Positive for constipation. Negative for blood in stool, diarrhea, nausea and vomiting.  Genitourinary: Positive for dyspareunia. Negative for dysuria, flank pain, frequency, hematuria, urgency, vaginal bleeding, vaginal discharge and vaginal pain.  Musculoskeletal: Negative for back pain.  Skin: Negative for rash.     OBJECTIVE:   Vitals:  BP 120/78 (BP Location: Left Arm, Patient Position: Sitting, Cuff Size: Normal)   Pulse 80   Ht 5\' 4"  (1.626 m)   Wt 139 lb (63 kg)   LMP 05/25/2017   BMI 23.86 kg/m   Physical Exam  Constitutional: She is oriented to person, place, and time and well-developed, well-nourished, and in no distress. Vital signs are normal.  Abdominal: Normal appearance. There is  tenderness in the right lower quadrant, suprapubic area and left lower quadrant. There is no rigidity and no guarding.  Genitourinary: Vagina normal, uterus normal, cervix normal, right adnexa normal, left adnexa normal and vulva normal. Uterus is not enlarged. Cervix exhibits no motion tenderness and no tenderness. Right adnexum displays no mass and no tenderness. Left adnexum displays no mass and no tenderness. Vulva exhibits no erythema, no exudate, no lesion, no rash and no tenderness. Vagina exhibits no lesion.  Neurological: She is alert and oriented to person, place, and time.  Psychiatric: Memory, affect and judgment normal.  Vitals reviewed.   Assessment/Plan: Menometrorrhagia - Check u/s. Will f/u with results. Discussed sx mgmt with OCPs, depo, nexplanon, IUD, lysteda, ablation, or hyst. Pt doesn't want depo but ok with other options. - Plan: US PELVIS TRANSVANGINAL NON-OB (TV ONLY)  DUB (dysfunctional uterine bleeding) - Check u/s. Will f/u with results.  - Plan: US PELVIS TRANSVANGINAL NON-OB (TV ONLY)    Return in about 2 days (around 05/27/2017) for GYN u/s for DUB/menorrhagia.  Alicia B. Copland, PA-C 05/25/2017 3:48 PM

## 2017-05-27 ENCOUNTER — Telehealth: Payer: Self-pay | Admitting: Obstetrics and Gynecology

## 2017-05-27 ENCOUNTER — Ambulatory Visit (INDEPENDENT_AMBULATORY_CARE_PROVIDER_SITE_OTHER): Payer: Managed Care, Other (non HMO)

## 2017-05-27 DIAGNOSIS — N938 Other specified abnormal uterine and vaginal bleeding: Secondary | ICD-10-CM

## 2017-05-27 DIAGNOSIS — N921 Excessive and frequent menstruation with irregular cycle: Secondary | ICD-10-CM

## 2017-05-27 NOTE — Telephone Encounter (Signed)
Pt aware of neg GYN u/s. WN=0.27OZ; LTO with follicles; RTO surg absent. Small amt FF in CDS. No evid of adenomyosis.  Pt interested in IUD. Will RTO with next menses for Mirena insertion. Discussed risks/benefits.

## 2017-06-22 ENCOUNTER — Telehealth: Payer: Self-pay | Admitting: Obstetrics and Gynecology

## 2017-06-22 NOTE — Telephone Encounter (Signed)
Pt is schedule 07/24/17 with Mirena insertion with [facility]

## 2017-06-23 ENCOUNTER — Encounter: Payer: Self-pay | Admitting: Obstetrics and Gynecology

## 2017-06-23 ENCOUNTER — Ambulatory Visit (INDEPENDENT_AMBULATORY_CARE_PROVIDER_SITE_OTHER): Payer: Managed Care, Other (non HMO) | Admitting: Obstetrics and Gynecology

## 2017-06-23 VITALS — BP 130/90 | HR 72 | Ht 64.0 in | Wt 142.0 lb

## 2017-06-23 DIAGNOSIS — Z30430 Encounter for insertion of intrauterine contraceptive device: Secondary | ICD-10-CM | POA: Diagnosis not present

## 2017-06-23 MED ORDER — LEVONORGESTREL 20 MCG/24HR IU IUD: 1.0000 | INTRAUTERINE_SYSTEM | Freq: Once | INTRAUTERINE | 0 refills | Status: DC

## 2017-06-23 NOTE — Progress Notes (Signed)
   Chief Complaint  Patient presents with  . Contraception     IUD PROCEDURE NOTE:  Ebony Steele is a 41 y.o. G5P0010 here for Mirena  IUD insertion for menorrhagia. Pt had neg eval with u/s and labs.  Last pap smear was normal.  BP 130/90   Pulse 72   Ht 5\' 4"  (1.626 m)   Wt 142 lb (64.4 kg)   LMP 06/21/2017   BMI 24.37 kg/m   IUD Insertion Procedure Note Patient identified, informed consent performed, consent signed.   Discussed risks of irregular bleeding, cramping, infection, malpositioning or misplacement of the IUD outside the uterus which may require further procedure such as laparoscopy, risk of failure <1%. Time out was performed.    A bimanual exam showed the uterus to be anteverted.  Speculum placed in the vagina.  Cervix visualized.  Cleaned with Betadine x 2.  Grasped anteriorly with a single tooth tenaculum.  Uterus sounded to 7.0 cm.   IUD placed per manufacturer's recommendations.  Strings trimmed to 3 cm. Tenaculum was removed, good hemostasis noted.  Patient tolerated procedure well.   ASSESSMENT:  Encounter for IUD insertion - Plan: levonorgestrel (MIRENA, 52 MG,) 20 MCG/24HR IUD   Meds ordered this encounter  Medications  . levonorgestrel (MIRENA, 52 MG,) 20 MCG/24HR IUD    Sig: 1 Intra Uterine Device (1 each total) by Intrauterine route once.    Dispense:  1 Intra Uterine Device    Refill:  0     Plan:  Patient was given post-procedure instructions.  She was advised to have backup contraception for one week.   Call if you are having increasing pain, cramps or bleeding or if you have a fever greater than 100.4 degrees F., shaking chills, nausea or vomiting. Patient was also asked to check IUD strings periodically and follow up in 4 weeks for IUD check.  Return in about 4 weeks (around 07/21/2017) for IUD check.  Esa Raden B. Kawan Valladolid, PA-C 06/23/2017 2:25 PM

## 2017-06-23 NOTE — Patient Instructions (Signed)

## 2017-07-21 ENCOUNTER — Encounter: Payer: Self-pay | Admitting: Obstetrics and Gynecology

## 2017-07-21 ENCOUNTER — Ambulatory Visit (INDEPENDENT_AMBULATORY_CARE_PROVIDER_SITE_OTHER): Payer: Managed Care, Other (non HMO) | Admitting: Obstetrics and Gynecology

## 2017-07-21 VITALS — BP 130/80 | HR 76 | Ht 64.0 in | Wt 145.0 lb

## 2017-07-21 DIAGNOSIS — Z30431 Encounter for routine checking of intrauterine contraceptive device: Secondary | ICD-10-CM | POA: Diagnosis not present

## 2017-07-21 NOTE — Patient Instructions (Signed)
I value your feedback and entrusting us with your care. If you get a [facility] patient survey, I would appreciate you taking the time to let us know about your experience today. Thank you! 

## 2017-07-21 NOTE — Progress Notes (Signed)
   Chief Complaint  Patient presents with  . Contraception     History of Present Illness:  Ebony Steele is a 41 y.o. that had a Mirena IUD placed approximately 1 month ago. Since that time, she denies pelvic pain, non-menstrual bleeding, vaginal d/c, heavy bleeding. She has a hx of menorrhagia and irregular cycles. She had an 8 day period last wk with cramping, but nothing worse than usual for pt. She is doing ok so far with IUD.  Review of Systems  Constitutional: Negative for fever.  Gastrointestinal: Negative for blood in stool, constipation, diarrhea, nausea and vomiting.  Genitourinary: Negative for dyspareunia, dysuria, flank pain, frequency, hematuria, urgency, vaginal bleeding, vaginal discharge and vaginal pain.  Musculoskeletal: Negative for back pain.  Skin: Negative for rash.    Physical Exam:  BP 130/80   Pulse 76   Ht 5\' 4"  (1.626 m)   Wt 145 lb (65.8 kg)   LMP 06/15/2017   BMI 24.89 kg/m  Body mass index is 24.89 kg/m.  Pelvic exam:  Two IUD strings present seen coming from the cervical os. EGBUS, vaginal vault and cervix: within normal limits   Assessment:  Routine checking of IUD Encounter for routine checking of intrauterine contraceptive device (IUD)  IUD strings present in proper location; pt doing well  Plan: F/u if any signs of infection or can no longer feel the strings.   Trivia Heffelfinger B. Latanza Pfefferkorn, PA-C 07/21/2017 8:54 AM

## 2017-10-27 NOTE — Telephone Encounter (Signed)
Pt's apt 06/23/17. Mirena received 06/23/17

## 2017-11-26 ENCOUNTER — Ambulatory Visit (INDEPENDENT_AMBULATORY_CARE_PROVIDER_SITE_OTHER): Payer: Managed Care, Other (non HMO) | Admitting: Physician Assistant

## 2017-11-26 VITALS — BP 156/80 | HR 102 | Temp 98.8°F | Resp 16 | Wt 166.0 lb

## 2017-11-26 DIAGNOSIS — E039 Hypothyroidism, unspecified: Secondary | ICD-10-CM

## 2017-11-26 DIAGNOSIS — R059 Cough, unspecified: Secondary | ICD-10-CM

## 2017-11-26 DIAGNOSIS — J101 Influenza due to other identified influenza virus with other respiratory manifestations: Secondary | ICD-10-CM

## 2017-11-26 DIAGNOSIS — R05 Cough: Secondary | ICD-10-CM | POA: Diagnosis not present

## 2017-11-26 DIAGNOSIS — F339 Major depressive disorder, recurrent, unspecified: Secondary | ICD-10-CM | POA: Diagnosis not present

## 2017-11-26 NOTE — Progress Notes (Signed)
Ebony Steele  Chief Complaint  Patient presents with  . URI    Started a few days ago.    Subjective:    Patient ID: Ebony Steele, female    DOB: [birth year], 42 y.o.   MRN: [id number]  Upper Respiratory Infection: Ebony Steele is a 42 y.o. female with a past medical history significant for exposure to the flucomplaining of symptoms of a URI. 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 Symptoms include congestion, cough, fever, plugged sensation in both ears and sore throat. Onset of symptoms was 4 days ago, gradually worsening since that time. She also c/o achiness, congestion, cough described as nonproductive, shortness of breath, sinus pressure, sore throat and wheezing for the past 1 day .  She is drinking plenty of fluids. Evaluation to date: none. Treatment to date: cough suppressants and decongestants. The treatment has provided minimal.   In the interim, she has gotten an IUD for menorrhagia.   Of note, she is not taking any of her medications. She is not taking Paxil. She says her boyfriend has returned from prison and everything is better now.   She is not taking Synthroid. Does not intend to.   Review of Systems  Constitutional: Positive for chills, diaphoresis, fatigue and fever. Negative for activity change, appetite change and unexpected weight change.  HENT: Positive for congestion, rhinorrhea, sinus pressure, sore throat, trouble swallowing and voice change. Negative for postnasal drip and sinus pain.   Respiratory: Positive for cough, chest tightness, shortness of breath and wheezing. Negative for apnea, choking and stridor.   Gastrointestinal: Positive for nausea. Negative for abdominal distention, abdominal pain, anal bleeding, blood in stool, constipation, diarrhea, rectal pain and vomiting.  Musculoskeletal: Positive for arthralgias and myalgias.  Neurological:  Positive for headaches. Negative for dizziness and light-headedness.  Hematological: Negative for adenopathy.       Objective:   BP (!) 156/80 (BP Location: Right Arm, Patient Position: Sitting, Cuff Size: Normal)   Pulse (!) 102   Temp 98.8 F (37.1 C) (Oral)   Resp 16   Wt 166 lb (75.3 kg)   LMP 11/23/2017   SpO2 97%   BMI 28.49 kg/m   Patient Active Problem List   Diagnosis Date Noted  . Abnormal chest CT 09/18/2015  . Cyst of ovary 09/17/2015  . Allergic rhinitis 03/09/2015  . Absolute anemia 03/09/2015  . 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 Headache disorder 03/09/2015  . CN (constipation) 03/09/2015  . Clinical depression 03/09/2015  . Esophageal reflux 03/09/2015  . BP (high blood pressure) 03/09/2015  . Insomnia 03/09/2015  . Adult hypothyroidism 04/19/2007  . Abnormal blood sugar 04/13/2007  . External hemorrhoids without complication 34/74/2595  . Big thyroid 04/13/2007  . Current tobacco use 04/13/2007    Outpatient Encounter Medications as of 11/26/2017  Medication Sig Note  . ALPRAZolam (XANAX) 0.25 MG tablet Take 1 tablet (0.25 mg total) by mouth daily as needed for anxiety.   . cetirizine (ZYRTEC ALLERGY) 10 MG tablet Take by mouth. 03/09/2015: Received from: [facility]  . traZODone (DESYREL) 50 MG tablet Take 0.5-1 tablets (25-50 mg total) by mouth at bedtime as needed for sleep.   . [DISCONTINUED] levothyroxine (SYNTHROID, LEVOTHROID) 88 MCG tablet Take 1 tablet (88 mcg total) by mouth daily.   Marland Kitchen levonorgestrel (MIRENA, 52 MG,) 20 MCG/24HR IUD 1 Intra Uterine Device (1 each total)  by Intrauterine route once.   . [DISCONTINUED] albuterol (PROAIR HFA) 108 (90 Base) MCG/ACT inhaler Inhale 2 puffs into the lungs every 6 (six) hours as needed for wheezing or shortness of breath. (Patient not taking: Reported on 11/26/2017)   . [DISCONTINUED] PARoxetine (PAXIL) 30 MG tablet Take 1 tablet (30 mg total) by mouth daily.    No  facility-administered encounter medications on file as of 11/26/2017.     Allergies  Allergen Reactions  . Acetaminophen        Physical Exam  Constitutional: She is oriented to person, place, and time.  HENT:  Right Ear: Tympanic membrane and external ear normal.  Left Ear: Hearing, tympanic membrane and external ear normal.  Mouth/Throat: Posterior oropharyngeal edema and posterior oropharyngeal erythema present. No oropharyngeal exudate.  Eyes: Conjunctivae are normal.  Neck: Neck supple.  Cardiovascular: Normal rate and regular rhythm.  Pulmonary/Chest: Effort normal and breath sounds normal.  Lymphadenopathy:    She has cervical adenopathy.  Neurological: She is alert and oriented to person, place, and time.  Skin: Skin is warm and dry.  Psychiatric: She has a normal mood and affect. Her behavior is normal.       Assessment & Plan:  1. Influenza A  Rapid flu is negative but she clinically appears to have the flu.   2. Adult hypothyroidism  Not taking Synthroid. Needs to follow up and have this monitored.  3. Episode of recurrent major depressive disorder, unspecified depression episode severity ([place])  Not taking Paxil. Says she feels better now.  4. Cough  - POCT Influenza A/B   Patient Instructions  Influenza, Adult Influenza ("the flu") is an infection in the lungs, nose, and throat (respiratory tract). It is caused by a virus. The flu causes many common cold symptoms, as well as a high fever and body aches. It can make you feel very sick. The flu spreads easily from person to person (is contagious). Getting a flu shot (influenza vaccination) every year is the best way to prevent the flu. Follow these instructions at home:  Take over-the-counter and prescription medicines only as told by your doctor.  Use a cool mist humidifier to add moisture (humidity) to the air in your home. This can make it easier to breathe.  Rest as needed.  Drink enough fluid to  keep your pee (urine) clear or pale yellow.  Cover your mouth and nose when you cough or sneeze.  Wash your hands with soap and water often, especially after you cough or sneeze. If you cannot use soap and water, use hand sanitizer.  Stay home from work or school as told by your doctor. Unless you are visiting your doctor, try to avoid leaving home until your fever has been gone for 24 hours without the use of medicine.  Keep all follow-up visits as told by your doctor. This is important. How is this prevented?  Getting a yearly (annual) flu shot is the best way to avoid getting the flu. You may get the flu shot in late summer, fall, or winter. Ask your doctor when you should get your flu shot.  Wash your hands often or use hand sanitizer often.  Avoid contact with people who are sick during cold and flu season.  Eat healthy foods.  Drink plenty of fluids.  Get enough sleep.  Exercise regularly. Contact a doctor if:  You get new symptoms.  You have: ? Chest pain. ? Watery poop (diarrhea). ? A fever.  Your cough gets  worse.  You start to have more mucus.  You feel sick to your stomach (nauseous).  You throw up (vomit). Get help right away if:  You start to be short of breath or have trouble breathing.  Your skin or nails turn a bluish color.  You have very bad pain or stiffness in your neck.  You get a sudden headache.  You get sudden pain in your face or ear.  You cannot stop throwing up. This information is not intended to replace advice given to you by your health care provider. Make sure you discuss any questions you have with your health care provider. Document Released: 05/27/2008 Document Revised: 01/24/2016 Document Reviewed: 06/12/2015 Elsevier Interactive Patient Education  2017 [employer].   Return if symptoms worsen or fail to improve.  The entirety of the information documented in the History of Present Illness, Review of Systems and Physical  Exam were personally obtained by me. Portions of this information were initially documented by Ashley Royalty, CMA and reviewed by me for thoroughness and accuracy.

## 2017-11-26 NOTE — Patient Instructions (Signed)

## 2017-11-27 LAB — POCT INFLUENZA A/B
Influenza A, POC: NEGATIVE
Influenza B, POC: NEGATIVE

## 2018-03-30 ENCOUNTER — Other Ambulatory Visit: Payer: Self-pay | Admitting: Physician Assistant

## 2018-03-30 DIAGNOSIS — F419 Anxiety disorder, unspecified: Secondary | ICD-10-CM

## 2018-03-30 NOTE — Telephone Encounter (Signed)
Last assessment of this issue was on 10/2017 when patient reported she felt completely better due to change in life circumstances and had stopped taking all medication including Xanax and Paxil. This was an aside to an influenza visit and I have not seen patient in ~ 1 year for depression and anxiety. I do not give Xanax out as monotherapy for this and so I am denying the refill request. If she would like to come to clinic to discuss anxiety, she should schedule an appointment. Also due for physical/thyroid check.

## 2018-06-11 ENCOUNTER — Encounter: Payer: Managed Care, Other (non HMO) | Admitting: Family Medicine

## 2018-06-29 ENCOUNTER — Other Ambulatory Visit (HOSPITAL_COMMUNITY)
Admission: RE | Admit: 2018-06-29 | Discharge: 2018-06-29 | Disposition: A | Discharge status: Home / Self Care | Payer: Managed Care, Other (non HMO) | Source: Ambulatory Visit | Attending: Family Medicine | Admitting: Family Medicine

## 2018-06-29 ENCOUNTER — Encounter: Payer: Self-pay | Admitting: Physician Assistant

## 2018-06-29 ENCOUNTER — Ambulatory Visit (INDEPENDENT_AMBULATORY_CARE_PROVIDER_SITE_OTHER): Payer: Managed Care, Other (non HMO) | Admitting: Physician Assistant

## 2018-06-29 VITALS — BP 118/80 | HR 98 | Temp 98.1°F | Resp 16 | Ht 64.0 in | Wt 175.0 lb

## 2018-06-29 DIAGNOSIS — Z131 Encounter for screening for diabetes mellitus: Secondary | ICD-10-CM

## 2018-06-29 DIAGNOSIS — N898 Other specified noninflammatory disorders of vagina: Secondary | ICD-10-CM

## 2018-06-29 DIAGNOSIS — E039 Hypothyroidism, unspecified: Secondary | ICD-10-CM | POA: Diagnosis not present

## 2018-06-29 DIAGNOSIS — B9689 Other specified bacterial agents as the cause of diseases classified elsewhere: Secondary | ICD-10-CM | POA: Insufficient documentation

## 2018-06-29 DIAGNOSIS — Z0000 Encounter for general adult medical examination without abnormal findings: Secondary | ICD-10-CM

## 2018-06-29 DIAGNOSIS — N760 Acute vaginitis: Secondary | ICD-10-CM | POA: Diagnosis not present

## 2018-06-29 NOTE — Patient Instructions (Signed)

## 2018-06-29 NOTE — Progress Notes (Signed)
Patient: Ebony Steele, Female    DOB: Aug 12, 1976, 42 y.o.   MRN: [id number] Visit Date: 06/29/2018  Today's Provider: Trinna Post, PA-C   Chief Complaint  Patient presents with  . Annual Exam   Subjective:    Annual physical exam Ebony Steele is a 42 y.o. female who presents today for health maintenance and complete physical. She feels well. She reports exercising none. She reports she is sleeping fairly well. 03/23/2017 pap/hpv-negative  Wt Readings from Last 3 Encounters:  06/29/18 175 lb (79.4 kg)  11/26/17 166 lb (75.3 kg)  07/21/17 145 lb (65.8 kg)   She reports a 30 lb weight gain in the past year. Previously she was being seen for depression and was having trouble with appetite, placed on Remeron but did not tolerate. Has since discontinued all depression medications. Has separated from her boyfriend.   Hypothyroidism: Documented history of hypothyroidism, patient does not take her synthroid because she does not feel she needs to. Thyroid ultrasound from 2008 shows "thyroid lobes appear mildly enlarged bilaterally and show a diffusely nonhomogeneous echo pattern but with no focal nodularity or mass identified."  Lab Results  Component Value Date   TSH 7.230 (H) 06/29/2018   Vaginal Odor: She reports a vaginal odor that has been present for several weeks.  -----------------------------------------------------------------   Review of Systems  Constitutional: Positive for unexpected weight change.  HENT: Negative.   Eyes: Negative.   Respiratory: Negative.   Cardiovascular: Negative.   Gastrointestinal: Negative.   Endocrine: Negative.   Genitourinary: Positive for vaginal discharge.  Musculoskeletal: Positive for arthralgias.  Skin: Negative.   Allergic/Immunologic: Negative.   Neurological: Negative.   Hematological: Negative.   Psychiatric/Behavioral: Positive for sleep disturbance.    Social History      She  reports that she has been  smoking cigars. She has never used smokeless tobacco. She reports that she drinks alcohol. She reports that she does not use drugs.       Social History   Socioeconomic History  . Marital status: Divorced    Spouse name: Not on file  . Number of children: 3  . Years of education: College  . Highest education level: Not on file  Occupational History  . 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 Financial resource strain: Not on file  . Food insecurity:    Worry: Not on file    Inability: Not on file  . Transportation needs:    Medical: Not on file    Non-medical: Not on file  Tobacco Use  . Smoking status: Light Tobacco Smoker    Types: Cigars  . Smokeless tobacco: Never Used  . Tobacco comment: Smoked 1 cigar per day  Substance and Sexual Activity  . Alcohol use: Yes    Comment: Occasional Wine  . Drug use: No    Types: Marijuana    Comment: Pt reports using Marijuana to help with depression symptoms.  She stopped  the end of May 2018  . Sexual activity: Not Currently    Birth control/protection: None  Lifestyle  . Physical activity:    Days per week: Not on file    Minutes per session: Not on file  . Stress: Not on file  Relationships  . Social connections:    Talks on phone: Not on file    Gets together: Not on file    Attends religious service: Not  on file    Active member of club or organization: Not on file    Attends meetings of clubs or organizations: Not on file    Relationship status: Not on file  Other Topics Concern  . Not on file  Social History Narrative  . Not on file    Past Medical History:  Diagnosis Date  . Asthma   . Hypertension   . Thyroid disease      Patient Active Problem List   Diagnosis Date Noted  . Abnormal chest CT 09/18/2015  . Cyst of ovary 09/17/2015  . Allergic rhinitis 03/09/2015  . Absolute anemia 03/09/2015  . 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 Headache disorder 03/09/2015  .  CN (constipation) 03/09/2015  . Clinical depression 03/09/2015  . Esophageal reflux 03/09/2015  . BP (high blood pressure) 03/09/2015  . Insomnia 03/09/2015  . Hypothyroidism 04/19/2007  . Abnormal blood sugar 04/13/2007  . External hemorrhoids without complication 58/05/9832  . Big thyroid 04/13/2007  . Current tobacco use 04/13/2007    Past Surgical History:  Procedure Laterality Date  . DILATION AND CURETTAGE OF UTERUS  2005  . LAPAROSCOPY  2000  . OOPHORECTOMY Right 2000   Tertoma  . TUBAL LIGATION      Family History        Family Status  Relation Name Status  . Mother  Alive  . Father  Deceased  . Brother  Alive        Her family history includes Depression in her mother; Diabetes in her father and mother; Diabetes Mellitus I in her brother; Glaucoma in her mother; Hypertension in her mother; Kidney disease in her father.      Allergies  Allergen Reactions  . Acetaminophen      Current Outpatient Medications:  .  levonorgestrel (MIRENA, 52 MG,) 20 MCG/24HR IUD, 1 Intra Uterine Device (1 each total) by Intrauterine route once., Disp: 1 Intra Uterine Device, Rfl: 0 .  traZODone (DESYREL) 50 MG tablet, Take 0.5-1 tablets (25-50 mg total) by mouth at bedtime as needed for sleep., Disp: 30 tablet, Rfl: 3 .  ALPRAZolam (XANAX) 0.25 MG tablet, Take 1 tablet (0.25 mg total) by mouth daily as needed for anxiety. (Patient not taking: Reported on 06/29/2018), Disp: 30 tablet, Rfl: 0   Patient Care Team: Paulene Floor as PCP - General (Physician Assistant)      Objective:   Vitals: BP 118/80 (BP Location: Left Arm, Patient Position: Sitting, Cuff Size: Normal)   Pulse 98   Temp 98.1 F (36.7 C) (Oral)   Resp 16   Ht 5\' 4"  (1.626 m)   Wt 175 lb (79.4 kg)   SpO2 97%   BMI 30.04 kg/m    Vitals:   06/29/18 0913  BP: 118/80  Pulse: 98  Resp: 16  Temp: 98.1 F (36.7 C)  TempSrc: Oral  SpO2: 97%  Weight: 175 lb (79.4 kg)  Height: 5\' 4"  (1.626 m)       Physical Exam  Constitutional: She is oriented to person, place, and time. She appears well-developed and well-nourished.  Neck: Thyromegaly present.  Cardiovascular: Normal rate and regular rhythm.  Pulmonary/Chest: Effort normal and breath sounds normal.  Neurological: She is alert and oriented to person, place, and time.  Skin: Skin is warm and dry.  Psychiatric: She has a normal mood and affect. Her behavior is normal.     Depression Screen PHQ 2/9 Scores 06/29/2018 05/21/2017 04/21/2017 09/16/2016  PHQ - 2 Score 5 4  5 6  PHQ- 9 Score 13 11 17  -      Assessment & Plan:     Routine Health Maintenance and Physical Exam  Exercise Activities and Dietary recommendations Goals    . Exercise 150 minutes per week (moderate activity)       Immunization History  Administered Date(s) Administered  . Tdap 01/28/2012    Health Maintenance  Topic Date Due  . INFLUENZA VACCINE  04/01/2018  . PAP SMEAR  03/17/2020  . TETANUS/TDAP  01/27/2022  . HIV Screening  Completed     Discussed health benefits of physical activity, and encouraged her to engage in regular exercise appropriate for her age and condition.    1. Annual physical exam  - CBC with Differential - Lipid Profile - Comprehensive Metabolic Panel (CMET)  2. Vaginal odor  - Cervicovaginal ancillary only  3. Hypothyroidism, unspecified type  TSH elevated and goiter present today on exam. Restart synthroid at 88 mcg and see her in 6 weeks.   - TSH+T4F+T3Free  4. Diabetes mellitus screening  - HgB A1c  Return in about 6 weeks (around 08/10/2018) for thyroid .  The entirety of the information documented in the History of Present Illness, Review of Systems and Physical Exam were personally obtained by me. Portions of this information were initially documented by Lynford Humphrey, CMA and reviewed by me for thoroughness and accuracy.    --------------------------------------------------------------------    Trinna Post, PA-C  [facility]

## 2018-06-30 ENCOUNTER — Telehealth: Payer: Self-pay

## 2018-06-30 LAB — COMPREHENSIVE METABOLIC PANEL
ALT: 9 IU/L (ref 0–32)
AST: 15 IU/L (ref 0–40)
Albumin/Globulin Ratio: 1.6 (ref 1.2–2.2)
Albumin: 4.3 g/dL (ref 3.5–5.5)
Alkaline Phosphatase: 84 IU/L (ref 39–117)
BUN/Creatinine Ratio: 10 (ref 9–23)
BUN: 8 mg/dL (ref 6–24)
Bilirubin Total: <0.2 mg/dL (ref 0.0–1.2)
CO2: 21 mmol/L (ref 20–29)
Calcium: 9.3 mg/dL (ref 8.7–10.2)
Chloride: 107 mmol/L — ABNORMAL HIGH (ref 96–106)
Creatinine, Ser: 0.81 mg/dL (ref 0.57–1.00)
GFR calc Af Amer: 104 mL/min/{1.73_m2} (ref >59)
GFR calc non Af Amer: 90 mL/min/{1.73_m2} (ref >59)
Globulin, Total: 2.7 g/dL (ref 1.5–4.5)
Glucose: 79 mg/dL (ref 65–99)
Potassium: 3.9 mmol/L (ref 3.5–5.2)
Sodium: 141 mmol/L (ref 134–144)
Total Protein: 7 g/dL (ref 6.0–8.5)

## 2018-06-30 LAB — CBC WITH DIFFERENTIAL/PLATELET
Basophils Absolute: 0 10*3/uL (ref 0.0–0.2)
Basos: 1 %
EOS (ABSOLUTE): 0.2 10*3/uL (ref 0.0–0.4)
Eos: 4 %
Hematocrit: 40 % (ref 34.0–46.6)
Hemoglobin: 13.2 g/dL (ref 11.1–15.9)
Immature Grans (Abs): 0 10*3/uL (ref 0.0–0.1)
Immature Granulocytes: 0 %
Lymphocytes Absolute: 1.7 10*3/uL (ref 0.7–3.1)
Lymphs: 40 %
MCH: 28.4 pg (ref 26.6–33.0)
MCHC: 33 g/dL (ref 31.5–35.7)
MCV: 86 fL (ref 79–97)
Monocytes Absolute: 0.4 10*3/uL (ref 0.1–0.9)
Monocytes: 9 %
Neutrophils Absolute: 1.9 10*3/uL (ref 1.4–7.0)
Neutrophils: 46 %
Platelets: 232 10*3/uL (ref 150–450)
RBC: 4.65 x10E6/uL (ref 3.77–5.28)
RDW: 12.2 % — ABNORMAL LOW (ref 12.3–15.4)
WBC: 4.1 10*3/uL (ref 3.4–10.8)

## 2018-06-30 LAB — HEMOGLOBIN A1C
Est. average glucose Bld gHb Est-mCnc: 111 mg/dL
Hgb A1c MFr Bld: 5.5 % (ref 4.8–5.6)

## 2018-06-30 LAB — TSH+T4F+T3FREE
Free T4: 0.82 ng/dL (ref 0.82–1.77)
T3, Free: 2.9 pg/mL (ref 2.0–4.4)
TSH: 7.23 u[IU]/mL — ABNORMAL HIGH (ref 0.450–4.500)

## 2018-06-30 LAB — LIPID PANEL
Chol/HDL Ratio: 2.7 ratio (ref 0.0–4.4)
Cholesterol, Total: 148 mg/dL (ref 100–199)
HDL: 55 mg/dL (ref >39)
LDL Calculated: 76 mg/dL (ref 0–99)
Triglycerides: 86 mg/dL (ref 0–149)
VLDL Cholesterol Cal: 17 mg/dL (ref 5–40)

## 2018-06-30 NOTE — Telephone Encounter (Signed)
-----   Message from Trinna Post, [place] sent at 06/30/2018  9:05 AM EDT ----- Labs normal except for TSH, which is elevated at 7.230. The follow up labs are low normal and she should restart her synthroid at 88 mcg and follow up in 6 weeks.

## 2018-06-30 NOTE — Telephone Encounter (Signed)
Patient advised as below.  

## 2018-06-30 NOTE — Telephone Encounter (Signed)
Patient viewed results on mychart.

## 2018-07-01 LAB — CERVICOVAGINAL ANCILLARY ONLY
Bacterial vaginitis: POSITIVE — AB
Candida vaginitis: NEGATIVE
Chlamydia: NEGATIVE
Neisseria Gonorrhea: NEGATIVE
Trichomonas: NEGATIVE

## 2018-07-02 ENCOUNTER — Encounter: Payer: Self-pay | Admitting: Physician Assistant

## 2018-07-02 ENCOUNTER — Telehealth: Payer: Self-pay

## 2018-07-02 DIAGNOSIS — E039 Hypothyroidism, unspecified: Secondary | ICD-10-CM

## 2018-07-02 DIAGNOSIS — B9689 Other specified bacterial agents as the cause of diseases classified elsewhere: Secondary | ICD-10-CM

## 2018-07-02 DIAGNOSIS — N760 Acute vaginitis: Secondary | ICD-10-CM

## 2018-07-02 MED ORDER — LEVOTHYROXINE SODIUM 88 MCG PO TABS: 88.0000 ug | ORAL_TABLET | Freq: Every day | ORAL | 3 refills | Status: DC

## 2018-07-02 MED ORDER — METRONIDAZOLE 500 MG PO TABS: 500.0000 mg | ORAL_TABLET | Freq: Two times a day (BID) | ORAL | 0 refills | Status: AC

## 2018-07-02 NOTE — Telephone Encounter (Signed)
Sent in flagyl and synthroid.

## 2018-07-02 NOTE — Telephone Encounter (Signed)
-----   Message from Trinna Post, [place] sent at 07/02/2018 11:57 AM EDT ----- Patient's swab came back positive for bacterial vaginosis. Treatment is flagyl either vaginal gel or oral. Which would she like?

## 2018-07-02 NOTE — Telephone Encounter (Signed)
Pt would like to take Flagyl.  She also states she needs levothyroxine 81mcg sent to [place].   Thanks,   -Mickel Baas

## 2018-08-10 NOTE — Progress Notes (Signed)
Patient: Ebony Steele Female    DOB: 1976-06-16   42 y.o.   MRN: [id number] Visit Date: 08/11/2018  Today's Provider: Trinna Post, PA-C   Chief Complaint  Patient presents with  . Follow-up    TSH   Subjective:    HPI   Hypothyroid, follow-up:  TSH  Date Value Ref Range Status  06/29/2018 7.230 (H) 0.450 - 4.500 uIU/mL Final  09/22/2016 2.940 0.450 - 4.500 uIU/mL Final  09/07/2015 2.110 0.450 - 4.500 uIU/mL Final   Wt Readings from Last 3 Encounters:  08/11/18 170 lb 3.2 oz (77.2 kg)  06/29/18 175 lb (79.4 kg)  11/26/17 166 lb (75.3 kg)    She was last seen for hypothyroid 6 weeks ago.  Management since that visit includesrestart her synthroid at 88 mcg  . She reports excellent compliance with treatment. She is not having side effects.  She is not exercising. She is experiencing none She denies change in energy level, diarrhea, heat / cold intolerance, nervousness and palpitations Weight trend: decreasing steadily  BP Readings from Last 3 Encounters:  08/11/18 (!) 128/92  06/29/18 118/80  11/26/17 (!) 156/80    ------------------------------------------------------------------------     Allergies  Allergen Reactions  . Acetaminophen      Current Outpatient Medications:  .  levonorgestrel (MIRENA, 52 MG,) 20 MCG/24HR IUD, 1 Intra Uterine Device (1 each total) by Intrauterine route once., Disp: 1 Intra Uterine Device, Rfl: 0 .  levothyroxine (SYNTHROID, LEVOTHROID) 88 MCG tablet, Take 1 tablet (88 mcg total) by mouth daily., Disp: 90 tablet, Rfl: 3 .  traZODone (DESYREL) 50 MG tablet, Take 0.5-1 tablets (25-50 mg total) by mouth at bedtime as needed for sleep., Disp: 30 tablet, Rfl: 3  Review of Systems  ROS negative except for HPI.   Social History   Tobacco Use  . Smoking status: Light Tobacco Smoker    Types: Cigars  . Smokeless tobacco: Never Used  . Tobacco comment: Smoked 1 cigar per day  Substance Use Topics  . Alcohol use:  Yes    Comment: Occasional Wine   Objective:   BP (!) 128/92 (BP Location: Right Arm, Patient Position: Sitting, Cuff Size: Large)   Pulse 89   Temp 98.4 F (36.9 C) (Oral)   Resp 16   Wt 170 lb 3.2 oz (77.2 kg)   BMI 29.21 kg/m  Vitals:   08/11/18 0840  BP: (!) 128/92  Pulse: 89  Resp: 16  Temp: 98.4 F (36.9 C)  TempSrc: Oral  Weight: 170 lb 3.2 oz (77.2 kg)     Physical Exam Constitutional:      Appearance: Normal appearance.  Neck:     Musculoskeletal: Neck supple.     Thyroid: Thyromegaly present. No thyroid mass or thyroid tenderness.     Comments: Thyroid looks smaller than last visit but still large.  Cardiovascular:     Rate and Rhythm: Normal rate and regular rhythm.     Heart sounds: Normal heart sounds.  Pulmonary:     Effort: Pulmonary effort is normal.     Breath sounds: Normal breath sounds.  Neurological:     Mental Status: She is alert and oriented to person, place, and time. Mental status is at baseline.  Psychiatric:        Mood and Affect: Mood normal.        Behavior: Behavior normal.         Assessment & Plan:     1.  Hypothyroidism, unspecified type  Would like to get ultrasound as thyroid remains enlarged though patient declines. TSH WNL, continue synthroid 88 mcg daily.   - TSH  2. Bacterial vaginitis  She reports recurrence of this. She wants her IUD removed. Have counseled that she will have irregular bleeding again and she understands this. Had tubal ligation in the past.  - metroNIDAZOLE (METROGEL) 0.75 % vaginal gel; Place 1 Applicatorful vaginally at bedtime for 5 days.  Dispense: 70 g; Refill: 0  Return for iud removal .  The entirety of the information documented in the History of Present Illness, Review of Systems and Physical Exam were personally obtained by me. Portions of this information were initially documented by Lyndel Pleasure, CMA and reviewed by me for thoroughness and accuracy.           Trinna Post,  PA-C  [facility]

## 2018-08-11 ENCOUNTER — Encounter: Payer: Self-pay | Admitting: Physician Assistant

## 2018-08-11 ENCOUNTER — Ambulatory Visit (INDEPENDENT_AMBULATORY_CARE_PROVIDER_SITE_OTHER): Payer: Managed Care, Other (non HMO) | Admitting: Physician Assistant

## 2018-08-11 VITALS — BP 128/92 | HR 89 | Temp 98.4°F | Resp 16 | Wt 170.2 lb

## 2018-08-11 DIAGNOSIS — E039 Hypothyroidism, unspecified: Secondary | ICD-10-CM

## 2018-08-11 DIAGNOSIS — B9689 Other specified bacterial agents as the cause of diseases classified elsewhere: Secondary | ICD-10-CM | POA: Diagnosis not present

## 2018-08-11 DIAGNOSIS — N760 Acute vaginitis: Secondary | ICD-10-CM | POA: Diagnosis not present

## 2018-08-11 MED ORDER — METRONIDAZOLE 0.75 % VA GEL: 1.0000 | Freq: Every day | VAGINAL | 0 refills | Status: AC

## 2018-08-12 ENCOUNTER — Telehealth: Payer: Self-pay

## 2018-08-12 LAB — TSH: TSH: 1.84 u[IU]/mL (ref 0.450–4.500)

## 2018-08-12 NOTE — Telephone Encounter (Signed)
Patient states she viewed her results on mychart.

## 2018-08-12 NOTE — Telephone Encounter (Signed)
-----   Message from Trinna Post, [place] sent at 08/12/2018  8:55 AM EST ----- Her TSH is back to normal. Please schedule 6 month follow up to recheck this.

## 2018-08-18 NOTE — Patient Instructions (Signed)
Hypothyroidism  Hypothyroidism is when the thyroid gland does not make enough of certain hormones (it is underactive). The thyroid gland is a small gland located in the lower front part of the neck, just in front of the windpipe (trachea). This gland makes hormones that help control how the body uses food for energy (metabolism) as well as how the heart and brain function. These hormones also play a role in keeping your bones strong. When the thyroid is underactive, it produces too little of the hormones thyroxine (T4) and triiodothyronine (T3). What are the causes? This condition may be caused by:  Hashimoto's disease. This is a disease in which the body's disease-fighting system (immune system) attacks the thyroid gland. This is the most common cause.  Viral infections.  Pregnancy.  Certain medicines.  Birth defects.  Past radiation treatments to the head or neck for cancer.  Past treatment with radioactive iodine.  Past exposure to radiation in the environment.  Past surgical removal of part or all of the thyroid.  Problems with a gland in the center of the brain (pituitary gland).  Lack of enough iodine in the diet. What increases the risk? You are more likely to develop this condition if:  You are female.  You have a family history of thyroid conditions.  You use a medicine called lithium.  You take medicines that affect the immune system (immunosuppressants). What are the signs or symptoms? Symptoms of this condition include:  Feeling as though you have no energy (lethargy).  Not being able to tolerate cold.  Weight gain that is not explained by a change in diet or exercise habits.  Lack of appetite.  Dry skin.  Coarse hair.  Menstrual irregularity.  Slowing of thought processes.  Constipation.  Sadness or depression. How is this diagnosed? This condition may be diagnosed based on:  Your symptoms, your medical history, and a physical exam.  Blood  tests. You may also have imaging tests, such as an ultrasound or MRI. How is this treated? This condition is treated with medicine that replaces the thyroid hormones that your body does not make. After you begin treatment, it may take several weeks for symptoms to go away. Follow these instructions at home:  Take over-the-counter and prescription medicines only as told by your health care provider.  If you start taking any new medicines, tell your health care provider.  Keep all follow-up visits as told by your health care provider. This is important. ? As your condition improves, your dosage of thyroid hormone medicine may change. ? You will need to have blood tests regularly so that your health care provider can monitor your condition. Contact a health care provider if:  Your symptoms do not get better with treatment.  You are taking thyroid replacement medicine and you: ? Sweat a lot. ? Have tremors. ? Feel anxious. ? Lose weight rapidly. ? Cannot tolerate heat. ? Have emotional swings. ? Have diarrhea. ? Feel weak. Get help right away if you have:  Chest pain.  An irregular heartbeat.  A rapid heartbeat.  Difficulty breathing. Summary  Hypothyroidism is when the thyroid gland does not make enough of certain hormones (it is underactive).  When the thyroid is underactive, it produces too little of the hormones thyroxine (T4) and triiodothyronine (T3).  The most common cause is Hashimoto's disease, a disease in which the body's disease-fighting system (immune system) attacks the thyroid gland. The condition can also be caused by viral infections, medicine, pregnancy, or past   radiation treatment to the head or neck.  Symptoms may include weight gain, dry skin, constipation, feeling as though you do not have energy, and not being able to tolerate cold.  This condition is treated with medicine to replace the thyroid hormones that your body does not make. This information  is not intended to replace advice given to you by your health care provider. Make sure you discuss any questions you have with your health care provider. Document Released: 08/18/2005 Document Revised: 07/29/2017 Document Reviewed: 07/29/2017 Elsevier Interactive Patient Education  2019 Elsevier Inc.  

## 2018-09-15 ENCOUNTER — Ambulatory Visit: Payer: Self-pay | Admitting: Family Medicine

## 2018-11-15 ENCOUNTER — Encounter: Payer: Self-pay | Admitting: Obstetrics and Gynecology

## 2018-11-15 ENCOUNTER — Other Ambulatory Visit: Payer: Self-pay

## 2018-11-15 ENCOUNTER — Ambulatory Visit (INDEPENDENT_AMBULATORY_CARE_PROVIDER_SITE_OTHER): Payer: Managed Care, Other (non HMO) | Admitting: Obstetrics and Gynecology

## 2018-11-15 VITALS — BP 120/86 | HR 76 | Ht 64.0 in | Wt 158.0 lb

## 2018-11-15 DIAGNOSIS — N941 Unspecified dyspareunia: Secondary | ICD-10-CM

## 2018-11-15 DIAGNOSIS — N930 Postcoital and contact bleeding: Secondary | ICD-10-CM

## 2018-11-15 DIAGNOSIS — N921 Excessive and frequent menstruation with irregular cycle: Secondary | ICD-10-CM | POA: Diagnosis not present

## 2018-11-15 DIAGNOSIS — Z30432 Encounter for removal of intrauterine contraceptive device: Secondary | ICD-10-CM | POA: Diagnosis not present

## 2018-11-15 NOTE — Patient Instructions (Signed)
I value your feedback and entrusting us with your care. If you get a [facility] patient survey, I would appreciate you taking the time to let us know about your experience today. Thank you! 

## 2018-11-15 NOTE — Progress Notes (Signed)
Ebony Post, PA-C   Chief Complaint  Patient presents with  . Vaginal Bleeding    after intercourse, pressure/pain during intercourse x 3-4 months  . Vaginal Odor    with little discharge, on/off x last yr    HPI:      [name] is a 43 y.o. [id number] who LMP was Patient's last menstrual period was 10/24/2018 (approximate)., presents today for bleeding and dyspareunia with IUD. Mirena placed 06/23/17 for menorrhagia/cramping. Pt is s/p TL. Still has monthly menses, lasting 10 days, mod flow with less cramping, but then has frequent light spotting throughout month.   Pt had sex a few days ago and bled significantly. No new sex partners. Having pain/pressure with sex for a few months, but no other pelvic pain. Pt ready to have it removed.   Had BV recently with PCP, treated. No sx today. Current on pap.  Past Medical History:  Diagnosis Date  . Asthma   . BV (bacterial vaginosis)   . Hypertension   . Menometrorrhagia   . Thyroid disease     Past Surgical History:  Procedure Laterality Date  . DILATION AND CURETTAGE OF UTERUS  2005  . LAPAROSCOPY  2000  . OOPHORECTOMY Right 2000   Tertoma  . TUBAL LIGATION      Family History  Problem Relation Age of Onset  . Diabetes Mother   . Depression Mother   . Hypertension Mother   . Glaucoma Mother   . Kidney disease Father   . Diabetes Father   . Diabetes Mellitus I Brother     Social History   Socioeconomic History  . Marital status: Divorced    Spouse name: Not on file  . Number of children: 3  . Years of education: College  . Highest education level: Not on file  Occupational History  . 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 Financial resource strain: Not on file  . Food insecurity:    Worry: Not on file    Inability: Not on file  . Transportation needs:    Medical: Not on file    Non-medical: Not on file  Tobacco Use  . Smoking status: Light  Tobacco Smoker    Types: Cigars  . Smokeless tobacco: Never Used  . Tobacco comment: Smoked 1 cigar per day  Substance and Sexual Activity  . Alcohol use: Yes    Comment: Occasional Wine  . Drug use: No    Types: Marijuana    Comment: Pt reports using Marijuana to help with depression symptoms.  She stopped  the end of May 2018  . Sexual activity: Yes    Birth control/protection: I.U.D.    Comment: Mirena  Lifestyle  . Physical activity:    Days per week: Not on file    Minutes per session: Not on file  . Stress: Not on file  Relationships  . Social connections:    Talks on phone: Not on file    Gets together: Not on file    Attends religious service: Not on file    Active member of club or organization: Not on file    Attends meetings of clubs or organizations: Not on file    Relationship status: Not on file  . Intimate partner violence:    Fear of current or ex partner: Not on file    Emotionally abused: Not on file    Physically abused: Not on  file    Forced sexual activity: Not on file  Other Topics Concern  . Not on file  Social History Narrative  . Not on file    Outpatient Medications Prior to Visit  Medication Sig Dispense Refill  . levothyroxine (SYNTHROID, LEVOTHROID) 88 MCG tablet Take 1 tablet (88 mcg total) by mouth daily. 90 tablet 3  . traZODone (DESYREL) 50 MG tablet Take 0.5-1 tablets (25-50 mg total) by mouth at bedtime as needed for sleep. 30 tablet 3  . levonorgestrel (MIRENA, 52 MG,) 20 MCG/24HR IUD 1 Intra Uterine Device (1 each total) by Intrauterine route once. 1 Intra Uterine Device 0   No facility-administered medications prior to visit.       ROS:  Review of Systems  Constitutional: Negative for fever.  Gastrointestinal: Negative for blood in stool, constipation, diarrhea, nausea and vomiting.  Genitourinary: Positive for dyspareunia, menstrual problem and vaginal discharge. Negative for dysuria, flank pain, frequency, hematuria,  urgency, vaginal bleeding and vaginal pain.  Musculoskeletal: Negative for back pain.  Skin: Negative for rash.     OBJECTIVE:   Vitals:  BP 120/86   Pulse 76   Ht 5\' 4"  (1.626 m)   Wt 158 lb (71.7 kg)   LMP 10/24/2018 (Approximate)   BMI 27.12 kg/m   Physical Exam Vitals signs reviewed.  Constitutional:      Appearance: She is well-developed.  Pulmonary:     Effort: Pulmonary effort is normal.  Genitourinary:    General: Normal vulva.     Pubic Area: No rash.      Labia:        Right: No rash, tenderness or lesion.        Left: No rash, tenderness or lesion.      Vagina: Bleeding present. No vaginal discharge, erythema or tenderness.     Cervix: Normal.     Uterus: Normal. Not enlarged and not tender.      Adnexa: Right adnexa normal and left adnexa normal.       Right: No mass or tenderness.         Left: No mass or tenderness.       Comments: IUD STRINGS IN PLACE Musculoskeletal: Normal range of motion.  Neurological:     Mental Status: She is alert and oriented to person, place, and time.  Psychiatric:        Behavior: Behavior normal.        Thought Content: Thought content normal.    IUD Removal Strings of IUD identified and grasped.  IUD removed without problem with ring forceps.  Pt tolerated this well.  IUD noted to be intact. IUD seemed close to cx os.    Assessment/Plan: Encounter for IUD removal  Dyspareunia in female - See if sx improve after IUD removal. F/u prn.   Postcoital bleeding - See if sx resolved with IUD removal.  Menometrorrhagia - Discussed lysteda, ablation, BC. Pt to follow cycles for a month or so and f/u prn.     Return if symptoms worsen or fail to improve.  Graison Leinberger B. Jermario Kalmar, PA-C 11/15/2018 11:39 AM

## 2018-11-30 ENCOUNTER — Encounter: Payer: Self-pay | Admitting: Physician Assistant

## 2018-11-30 NOTE — Telephone Encounter (Signed)
Accidentally sent to me

## 2018-12-08 ENCOUNTER — Encounter: Payer: Self-pay | Admitting: Physician Assistant

## 2018-12-08 ENCOUNTER — Telehealth: Payer: 59 | Admitting: Nurse Practitioner

## 2018-12-08 DIAGNOSIS — Z20822 Contact with and (suspected) exposure to covid-19: Secondary | ICD-10-CM

## 2018-12-08 DIAGNOSIS — R6889 Other general symptoms and signs: Secondary | ICD-10-CM

## 2018-12-08 DIAGNOSIS — J069 Acute upper respiratory infection, unspecified: Secondary | ICD-10-CM

## 2018-12-08 MED ORDER — BENZONATATE 100 MG PO CAPS: 100.0000 mg | ORAL_CAPSULE | Freq: Three times a day (TID) | ORAL | 0 refills | Status: DC | PRN

## 2018-12-08 NOTE — Progress Notes (Signed)
E-Visit for Corona Virus Screening  Based on your current symptoms, you may very well have the virus, however your symptoms are mild. Currently, not all patients are being tested. If the symptoms are mild and there is not a known exposure, performing the test is not indicated.  Coronavirus disease 2019 (COVID-19) is a respiratory illness that can spread from person to person. The virus that causes COVID-19 is a new virus that was first identified in the country of [place] but is now found in multiple other countries and has spread to the [place].  Symptoms associated with the virus are mild to severe fever, cough, and shortness of breath. There is currently no vaccine to protect against COVID-19, and there is no specific antiviral treatment for the virus.   To be considered HIGH RISK for Coronavirus (COVID-19), you have to meet the following criteria:  . Traveled to [place], [place], [place], [place] or [place]; or in the [place] to [place], [place], [place], or [place]; and have fever, cough, and shortness of breath within the last 2 weeks of travel OR  . Been in close contact with a person diagnosed with COVID-19 within the last 2 weeks and have fever, cough, and shortness of breath  . IF YOU DO NOT MEET THESE CRITERIA, YOU ARE CONSIDERED LOW RISK FOR COVID-19.   It is vitally important that if you feel that you have an infection such as this virus or any other virus that you stay home and away from places where you may spread it to others.  You should self-quarantine for 14 days if you have symptoms that could potentially be coronavirus and avoid contact with people age 27 and older.   You can use medication such as A prescription cough medication called Tessalon Perles 100 mg. You may take 1-2 capsules every 8 hours as needed for cough  You may also take acetaminophen (Tylenol) as needed for fever.   Reduce your risk of any infection by using the same precautions used for  avoiding the common cold or flu:  Marland Kitchen Wash your hands often with soap and warm water for at least 20 seconds.  If soap and water are not readily available, use an alcohol-based hand sanitizer with at least 60% alcohol.  . If coughing or sneezing, cover your mouth and nose by coughing or sneezing into the elbow areas of your shirt or coat, into a tissue or into your sleeve (not your hands). . Avoid shaking hands with others and consider head nods or verbal greetings only. . Avoid touching your eyes, nose, or mouth with unwashed hands.  . Avoid close contact with people who are sick. . Avoid places or events with large numbers of people in one location, like concerts or sporting events. . Carefully consider travel plans you have or are making. . If you are planning any travel outside or inside the [place], visit the CDC's Travelers' Health webpage for the latest health notices. . If you have some symptoms but not all symptoms, continue to monitor at home and seek medical attention if your symptoms worsen. . If you are having a medical emergency, call 911.  HOME CARE . Only take medications as instructed by your medical team. . Drink plenty of fluids and get plenty of rest. . A steam or ultrasonic humidifier can help if you have congestion.   GET HELP RIGHT AWAY IF: . You develop worsening fever. . You become short of breath . You cough  up blood. . Your symptoms become more severe MAKE SURE YOU   Understand these instructions.  Will watch your condition.  Will get help right away if you are not doing well or get worse.  Your e-visit answers were reviewed by a board certified advanced clinical practitioner to complete your personal care plan.  Depending on the condition, your plan could have included both over the counter or prescription medications.  If there is a problem please reply once you have received a response from your provider. Your safety is important to [place].  If you have drug  allergies check your prescription carefully.    You can use MyChart to ask questions about today's visit, request a non-urgent call back, or ask for a work or school excuse for 24 hours related to this e-Visit. If it has been greater than 24 hours you will need to follow up with your provider, or enter a new e-Visit to address those concerns. You will get an e-mail in the next two days asking about your experience.  I hope that your e-visit has been valuable and will speed your recovery. Thank you for using e-visits.  5 minutes spent reviewing and documenting in chart.

## 2018-12-08 NOTE — Telephone Encounter (Signed)
Please call this patient to set up e-visit.

## 2018-12-09 DIAGNOSIS — R6889 Other general symptoms and signs: Secondary | ICD-10-CM | POA: Diagnosis not present

## 2018-12-09 DIAGNOSIS — R509 Fever, unspecified: Secondary | ICD-10-CM | POA: Diagnosis not present

## 2018-12-09 NOTE — Telephone Encounter (Signed)
Was the patient called?

## 2019-01-27 ENCOUNTER — Encounter: Payer: Self-pay | Admitting: Physician Assistant

## 2019-01-28 ENCOUNTER — Telehealth: Payer: Self-pay

## 2019-01-28 NOTE — Telephone Encounter (Signed)
Left message to call back regarding MyChart message. She is requesting a follow up appointment by virtual visit.

## 2019-02-01 ENCOUNTER — Telehealth: Payer: Self-pay | Admitting: Physician Assistant

## 2019-02-01 NOTE — Telephone Encounter (Signed)
[email or web address]

## 2019-02-01 NOTE — Telephone Encounter (Signed)
Can we please schedule a [email or web address] visit for depression and anxiety? Thanks.

## 2019-02-04 NOTE — Telephone Encounter (Signed)
Patient declined appt due to having another provider. sd

## 2019-02-04 NOTE — Telephone Encounter (Signed)
Noted, thank you

## 2020-01-13 ENCOUNTER — Other Ambulatory Visit: Payer: Self-pay

## 2020-01-13 ENCOUNTER — Ambulatory Visit (INDEPENDENT_AMBULATORY_CARE_PROVIDER_SITE_OTHER): Payer: 59 | Admitting: Family Medicine

## 2020-01-13 ENCOUNTER — Encounter: Payer: Self-pay | Admitting: Family Medicine

## 2020-01-13 ENCOUNTER — Ambulatory Visit: Payer: Managed Care, Other (non HMO) | Admitting: Physician Assistant

## 2020-01-13 VITALS — BP 131/86 | HR 75 | Temp 97.3°F | Resp 16 | Ht 64.0 in | Wt 141.2 lb

## 2020-01-13 DIAGNOSIS — R69 Illness, unspecified: Secondary | ICD-10-CM | POA: Diagnosis not present

## 2020-01-13 DIAGNOSIS — F411 Generalized anxiety disorder: Secondary | ICD-10-CM | POA: Diagnosis not present

## 2020-01-13 DIAGNOSIS — R41840 Attention and concentration deficit: Secondary | ICD-10-CM | POA: Diagnosis not present

## 2020-01-13 DIAGNOSIS — E039 Hypothyroidism, unspecified: Secondary | ICD-10-CM | POA: Diagnosis not present

## 2020-01-13 DIAGNOSIS — G4700 Insomnia, unspecified: Secondary | ICD-10-CM

## 2020-01-13 DIAGNOSIS — F332 Major depressive disorder, recurrent severe without psychotic features: Secondary | ICD-10-CM

## 2020-01-13 MED ORDER — LEVOTHYROXINE SODIUM 88 MCG PO TABS: 88.0000 ug | ORAL_TABLET | Freq: Every day | ORAL | 3 refills | Status: DC

## 2020-01-13 MED ORDER — PAROXETINE HCL 20 MG PO TABS: 20.0000 mg | ORAL_TABLET | Freq: Every day | ORAL | 3 refills | Status: DC

## 2020-01-13 MED ORDER — TRAZODONE HCL 50 MG PO TABS: 50.0000 mg | ORAL_TABLET | Freq: Every evening | ORAL | 3 refills | Status: AC | PRN

## 2020-01-13 NOTE — Assessment & Plan Note (Addendum)
Longstanding issue Worsening Tearful today Work and family stress has increased Restart Paxil 20 mg daily Encourage therapy and discussed synergistic effects with medication Discussed possible side effects of Paxil including GI upset, increased anxiety, sexual dysfunction Discussed it can take 6 to 8 weeks to reach full efficacy Follow up in 6 weeks Recheck PHQ 9 and GAD 7 next visit

## 2020-01-13 NOTE — Assessment & Plan Note (Addendum)
Uncontrolled She stopped taking synthroid for several months Weight loss Anxiety Recheck TSH Restart Synthroid Follow up and dose change pending lab result

## 2020-01-13 NOTE — Progress Notes (Signed)
Established patient visit   Patient: Ebony Steele   DOB: 26-Jul-1976   44 y.o. Female  MRN: [id number] Visit Date: 01/13/2020  I,Sulibeya S Dimas,acting as a scribe for Lavon Paganini, MD.,have documented all relevant documentation on the behalf of Lavon Paganini, MD,as directed by  Lavon Paganini, MD while in the presence of Lavon Paganini, MD.  Today's healthcare provider: Lavon Paganini, MD   Chief Complaint  Patient presents with  . Anxiety  . Depression  . Hypothyroidism   Subjective    Anxiety Presents for initial visit. Onset was 6 to 12 months ago. The problem has been gradually worsening. Symptoms include decreased concentration, depressed mood, excessive worry, insomnia, irritability and nervous/anxious behavior. Patient reports no suicidal ideas. Symptoms occur constantly. The severity of symptoms is interfering with daily activities, severe and causing significant distress. The symptoms are aggravated by family issues and work stress. The quality of sleep is poor. Nighttime awakenings: several.   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 Her past medical history is significant for anxiety/panic attacks and depression. Past treatments include counseling (CBT). The treatment provided mild relief. Compliance with prior treatments has been variable.   Depression screen [facility] 2/9 01/13/2020 06/29/2018 05/21/2017 04/21/2017 09/16/2016  Decreased Interest 3 3 3 3 3   Down, Depressed, Hopeless 3 2 1 2 3   PHQ - 2 Score 6 5 4 5 6   Altered sleeping 3 3 3 2  -  Tired, decreased energy 3 2 2 3  -  Change in appetite 3 2 1 3  -  Feeling bad or failure about yourself  2 0 0 1 -  Trouble concentrating 3 1 1 2  -  Moving slowly or fidgety/restless 3 0 0 1 -  Suicidal thoughts 0 0 0 0 -  PHQ-9 Score 23 13 11 17  -  Difficult doing work/chores Very difficult Very difficult Somewhat difficult Very difficult -   GAD 7 : Generalized Anxiety  Score 01/13/2020  Nervous, Anxious, on Edge 3  Control/stop worrying 3  Worry too much - different things 3  Trouble relaxing 3  Restless 3  Easily annoyed or irritable 3  Afraid - awful might happen 1  Total GAD 7 Score 19  Anxiety Difficulty Extremely difficult    Patient reports she may have ADHD. Patient reports that her counselor has also suggested she may have ADHD.  Hypothyroid, follow-up  Lab Results  Component Value Date   TSH 1.840 08/11/2018   TSH 7.230 (H) 06/29/2018   TSH 2.940 09/22/2016   FREET4 0.82 06/29/2018   Wt Readings from Last 3 Encounters:  01/13/20 141 lb 3.2 oz (64 kg)  11/15/18 158 lb (71.7 kg)  08/11/18 170 lb 3.2 oz (77.2 kg)    She was last seen for hypothyroid 08/11/2018.  Management since that visit includes no changes. She reports poor compliance with treatment. She is having side effects. Weight loss  Symptoms: No change in energy level No constipation No diarrhea No heat / cold intolerance Yes nervousness No palpitations Yes weight changes  -----------------------------------------------------------------------------------------    Patient Active Problem List   Diagnosis Date Noted  . GAD (generalized anxiety disorder) 01/13/2020  . Concentration deficit 01/13/2020  . Abnormal chest CT 09/18/2015  . Cyst of ovary 09/17/2015  . Allergic rhinitis 03/09/2015  . Absolute anemia 03/09/2015  . Headache disorder 03/09/2015  . CN (constipation) 03/09/2015  . Clinical depression 03/09/2015  . Esophageal reflux 03/09/2015  . Insomnia 03/09/2015  .  Hypothyroidism 04/19/2007  . Abnormal blood sugar 04/13/2007  . Big thyroid 04/13/2007  . Current tobacco use 04/13/2007   Social History   Tobacco Use  . Smoking status: Light Tobacco Smoker    Types: Cigars  . Smokeless tobacco: Never Used  . Tobacco comment: Smoked 1 cigar per day  Substance Use Topics  . Alcohol use: Yes    Comment: Occasional Wine  . Drug use: No     Types: Marijuana    Comment: Pt reports using Marijuana to help with depression symptoms.  She stopped  the end of May 2018       Medications: Outpatient Medications Prior to Visit  Medication Sig  . [DISCONTINUED] traZODone (DESYREL) 50 MG tablet Take 0.5-1 tablets (25-50 mg total) by mouth at bedtime as needed for sleep. (Patient taking differently: Take 25-50 mg by mouth at bedtime. 75mg  every night)  . [DISCONTINUED] benzonatate (TESSALON PERLES) 100 MG capsule Take 1 capsule (100 mg total) by mouth 3 (three) times daily as needed.  . [DISCONTINUED] levothyroxine (SYNTHROID, LEVOTHROID) 88 MCG tablet Take 1 tablet (88 mcg total) by mouth daily. (Patient not taking: Reported on 01/13/2020)   No facility-administered medications prior to visit.    Review of Systems  Constitutional: Positive for irritability.  HENT: Negative.   Respiratory: Negative.   Cardiovascular: Negative.   Neurological: Negative.   Psychiatric/Behavioral: Positive for decreased concentration, dysphoric mood and sleep disturbance. Negative for hallucinations, self-injury and suicidal ideas. The patient is nervous/anxious and has insomnia.     Objective    BP 131/86 (BP Location: Left Arm, Patient Position: Sitting, Cuff Size: Normal)   Pulse 75   Temp (!) 97.3 F (36.3 C) (Temporal)   Resp 16   Ht 5\' 4"  (1.626 m)   Wt 141 lb 3.2 oz (64 kg)   LMP 01/10/2020 (Exact Date)   BMI 24.24 kg/m  BP Readings from Last 3 Encounters:  01/13/20 131/86  11/15/18 120/86  08/11/18 (!) 128/92   Wt Readings from Last 3 Encounters:  01/13/20 141 lb 3.2 oz (64 kg)  11/15/18 158 lb (71.7 kg)  08/11/18 170 lb 3.2 oz (77.2 kg)      Physical Exam Vitals reviewed.  Constitutional:      General: She is in acute distress.     Appearance: She is well-developed.  HENT:     Head: Normocephalic and atraumatic.  Eyes:     General: No scleral icterus.    Conjunctiva/sclera: Conjunctivae normal.  Cardiovascular:      Rate and Rhythm: Normal rate and regular rhythm.     Heart sounds: Normal heart sounds. No murmur.  Pulmonary:     Effort: Pulmonary effort is normal. No respiratory distress.     Breath sounds: Normal breath sounds. No wheezing.  Abdominal:     General: There is no distension.     Palpations: Abdomen is soft.     Tenderness: There is no abdominal tenderness.  Musculoskeletal:     Cervical back: Neck supple.     Right lower leg: No edema.     Left lower leg: No edema.  Lymphadenopathy:     Cervical: No cervical adenopathy.  Skin:    General: Skin is warm and dry.     Findings: No rash.  Neurological:     Mental Status: She is alert and oriented to person, place, and time. Mental status is at baseline.  Psychiatric:        Mood and Affect: Mood is anxious  and depressed. Affect is tearful.        Speech: Speech normal.        Behavior: Behavior is hyperactive. Behavior is cooperative.        Thought Content: Thought content does not include homicidal or suicidal ideation. Thought content does not include homicidal or suicidal plan.     No results found for any visits on 01/13/20.  Assessment & Plan     Problem List Items Addressed This Visit      Endocrine   Hypothyroidism    Uncontrolled She stopped taking synthroid for several months Weight loss Anxiety Recheck TSH Restart Synthroid Follow up and dose change pending lab result      Relevant Medications   levothyroxine (SYNTHROID) 88 MCG tablet   Other Relevant Orders   TSH     Other   Clinical depression - Primary    Longstanding issue Worsening Tearful today Work and family stress has increased Restart Paxil 20 mg daily Encourage therapy and discussed synergistic effects with medication Discussed possible side effects of Paxil including GI upset, increased anxiety, sexual dysfunction Discussed it can take 6 to 8 weeks to reach full efficacy Follow up in 6 weeks Recheck PHQ 9 and GAD 7 next visit       Relevant Medications   PARoxetine (PAXIL) 20 MG tablet   traZODone (DESYREL) 50 MG tablet   Insomnia    Worsening Wakes up and is not able to go back to sleep Taking Trazodone 75mg  most nights Increase Trazodone to 50mg  to 100mg  PRN for sleep      Relevant Medications   traZODone (DESYREL) 50 MG tablet   GAD (generalized anxiety disorder)    Longstanding issue Worsening COVID-19 pandemic as well as family stressors contributed to her worsening anxiety Resume Paxil 20 mg daily Discussed it can take 6 to 8 weeks to reach full efficacy Discussed possible side effects of Paxil including GI upset, sexual dysfunction, worsening anxiety Encouraged ongoing therapy and discussed the synergistic effects of medication May take Xanax sparingly Goal would be to decrease Xanax use as Paxil gets to full efficacy Follow-up in 6 to 8 weeks Recheck PHQ-9 and GAD-7 at next visit      Relevant Medications   PARoxetine (PAXIL) 20 MG tablet   traZODone (DESYREL) 50 MG tablet   Concentration deficit    Longstanding issue that is worse currently Patient is concerned for possible ADD Discussed that uncontrolled anxiety and depression can mimic ADD symptoms We will resume Paxil as below If anxiety and depression are improving with Paxil and she continues to have concentration deficits, we will refer to neuropsychiatry for formal ADD testing before considering stimulant treatment          Return in about 6 weeks (around 02/24/2020) for chronic disease f/u.      I, Lavon Paganini, MD, have reviewed all documentation for this visit. The documentation on 01/13/20 for the exam, diagnosis, procedures, and orders are all accurate and complete.   Evelyn Aguinaldo, Dionne Bucy, MD, MPH [facility]

## 2020-01-13 NOTE — Assessment & Plan Note (Signed)
Longstanding issue that is worse currently Patient is concerned for possible ADD Discussed that uncontrolled anxiety and depression can mimic ADD symptoms We will resume Paxil as below If anxiety and depression are improving with Paxil and she continues to have concentration deficits, we will refer to neuropsychiatry for formal ADD testing before considering stimulant treatment

## 2020-01-13 NOTE — Assessment & Plan Note (Signed)
Longstanding issue Worsening COVID-19 pandemic as well as family stressors contributed to her worsening anxiety Resume Paxil 20 mg daily Discussed it can take 6 to 8 weeks to reach full efficacy Discussed possible side effects of Paxil including GI upset, sexual dysfunction, worsening anxiety Encouraged ongoing therapy and discussed the synergistic effects of medication May take Xanax sparingly Goal would be to decrease Xanax use as Paxil gets to full efficacy Follow-up in 6 to 8 weeks Recheck PHQ-9 and GAD-7 at next visit

## 2020-01-13 NOTE — Assessment & Plan Note (Addendum)
Worsening Wakes up and is not able to go back to sleep Taking Trazodone 75mg  most nights Increase Trazodone to 50mg  to 100mg  PRN for sleep

## 2020-01-14 LAB — TSH: TSH: 3.14 u[IU]/mL (ref 0.450–4.500)

## 2020-01-16 ENCOUNTER — Telehealth: Payer: Self-pay

## 2020-01-16 ENCOUNTER — Encounter: Payer: Self-pay | Admitting: Family Medicine

## 2020-01-16 NOTE — Telephone Encounter (Signed)
-----   Message from Virginia Crews, MD sent at 01/16/2020  8:35 AM EDT ----- Normal labs

## 2020-01-16 NOTE — Telephone Encounter (Signed)
Comment seen by patient Ebony Steele on 01/16/2020 8:35 AM EDT

## 2020-02-24 ENCOUNTER — Encounter: Payer: Self-pay | Admitting: Family Medicine

## 2020-02-24 ENCOUNTER — Telehealth (INDEPENDENT_AMBULATORY_CARE_PROVIDER_SITE_OTHER): Payer: 59 | Admitting: Family Medicine

## 2020-02-24 DIAGNOSIS — F411 Generalized anxiety disorder: Secondary | ICD-10-CM | POA: Diagnosis not present

## 2020-02-24 DIAGNOSIS — F332 Major depressive disorder, recurrent severe without psychotic features: Secondary | ICD-10-CM

## 2020-02-24 DIAGNOSIS — R69 Illness, unspecified: Secondary | ICD-10-CM | POA: Diagnosis not present

## 2020-02-24 DIAGNOSIS — G4700 Insomnia, unspecified: Secondary | ICD-10-CM | POA: Diagnosis not present

## 2020-02-24 MED ORDER — BUPROPION HCL ER (XL) 150 MG PO TB24
150.0000 mg | ORAL_TABLET | Freq: Every day | ORAL | 2 refills | Status: DC
Start: 2020-02-24 — End: 2020-10-09

## 2020-02-24 NOTE — Progress Notes (Signed)
MyChart Video Visit    Virtual Visit via Video Note   This visit type was conducted due to national recommendations for restrictions regarding the COVID-19 Pandemic (e.g. social distancing) in an effort to limit this patient's exposure and mitigate transmission in our community. This patient is at least at moderate risk for complications without adequate follow up. This format is felt to be most appropriate for this patient at this time. Physical exam was limited by quality of the video and audio technology used for the visit.    Patient location: home Provider location: [facility] Persons involved in the visit: patient, provider    Patient: Ebony Steele   DOB: 1976/01/09   44 y.o. Female  MRN: [id number] Visit Date: 02/24/2020  Today's healthcare provider: Lavon Paganini, MD   Chief Complaint  Patient presents with  . Depression   Subjective    HPI Depression/Anxiety, Follow-up  She  was last seen for this 6 weeks ago. Changes made at last visit include restart Paxil 20mg  daily.   She reports excellent compliance with treatment. She is not having side effects.   She reports excellent tolerance of treatment. Current symptoms include: depressed mood, difficulty concentrating, fatigue and insomnia She feels she is Unchanged since last visit.  Depression screen [facility] 2/9 02/24/2020 01/13/2020 06/29/2018  Decreased Interest 3 3 3   Down, Depressed, Hopeless 2 3 2   PHQ - 2 Score 5 6 5   Altered sleeping 2 3 3   Tired, decreased energy 3 3 2   Change in appetite 3 3 2   Feeling bad or failure about yourself  1 2 0  Trouble concentrating 3 3 1   Moving slowly or fidgety/restless 3 3 0  Suicidal thoughts 0 0 0  PHQ-9 Score 20 23 13   Difficult doing work/chores Extremely dIfficult Very difficult Very difficult    GAD 7 : Generalized Anxiety Score 02/24/2020 01/13/2020  Nervous, Anxious, on Edge 3 3  Control/stop worrying 3 3  Worry too much - different  things 3 3  Trouble relaxing 3 3  Restless 3 3  Easily annoyed or irritable 3 3  Afraid - awful might happen 0 1  Total GAD 7 Score 18 19  Anxiety Difficulty Extremely difficult Extremely difficult     ----------------------------------------------------------------------------------------- Follow up for Insomnia  The patient was last seen for this 6 weeks ago. Changes made at last visit include increase trazodone to 50mg  - 100mg  PRN.  She reports fair compliance with treatment. She feels that condition is Unchanged. She is not having side effects.   -----------------------------------------------------------------------------------------    Patient Active Problem List   Diagnosis Date Noted  . GAD (generalized anxiety disorder) 01/13/2020  . Concentration deficit 01/13/2020  . Abnormal chest CT 09/18/2015  . Cyst of ovary 09/17/2015  . Allergic rhinitis 03/09/2015  . Absolute anemia 03/09/2015  . Headache disorder 03/09/2015  . CN (constipation) 03/09/2015  . Clinical depression 03/09/2015  . Esophageal reflux 03/09/2015  . Insomnia 03/09/2015  . Hypothyroidism 04/19/2007  . Abnormal blood sugar 04/13/2007  . Big thyroid 04/13/2007  . Current tobacco use 04/13/2007   Social History   Tobacco Use  . Smoking status: Light Tobacco Smoker    Types: Cigars  . Smokeless tobacco: Never Used  . Tobacco comment: Smoked 1 cigar per day  Vaping Use  . Vaping Use: Never used  Substance Use Topics  . Alcohol use: Yes    Comment: Occasional Wine  . Drug use: No    Types:  Marijuana    Comment: Pt reports using Marijuana to help with depression symptoms.  She stopped  the end of May 2018      Medications: Outpatient Medications Prior to Visit  Medication Sig  . levothyroxine (SYNTHROID) 88 MCG tablet Take 1 tablet (88 mcg total) by mouth daily.  Marland Kitchen PARoxetine (PAXIL) 20 MG tablet Take 1 tablet (20 mg total) by mouth daily.  . traZODone (DESYREL) 50 MG tablet Take 1-2  tablets (50-100 mg total) by mouth at bedtime as needed for sleep.   No facility-administered medications prior to visit.    Review of Systems  Constitutional: Positive for appetite change and fatigue.  Respiratory: Negative for chest tightness and shortness of breath.   Cardiovascular: Negative for chest pain and palpitations.  Psychiatric/Behavioral: Positive for agitation, decreased concentration, dysphoric mood and sleep disturbance. The patient is nervous/anxious.       Objective    There were no vitals taken for this visit.   Physical Exam Constitutional:      General: She is not in acute distress.    Appearance: Normal appearance.  Pulmonary:     Effort: Pulmonary effort is normal. No respiratory distress.  Neurological:     Mental Status: She is alert and oriented to person, place, and time. Mental status is at baseline.  Psychiatric:        Mood and Affect: Mood is anxious and depressed. Affect is tearful.        Speech: Speech normal.        Behavior: Behavior is cooperative.        Thought Content: Thought content does not include homicidal or suicidal ideation.       Assessment & Plan     Problem List Items Addressed This Visit      Other   Clinical depression    Chronic and uncontrolled Minimal improvement on Paxil She had previously been well controlled on higher dose of Paxil Given that she is having some decreased libido with Paxil, we will not increase the dose currently Instead, add adjunct, Wellbutrin XL 150 mg daily Discussed possible side effects She has tolerated it well previously Contracted for safety-no SI/HI Discussed the synergistic effect of therapy with medications Follow-up in 1 month virtually and repeat PHQ-9 and GAD-7 Patient is contemplating FMLA leave and I will be willing to fill out paperwork for leave for 2 weeks after she talks to her boss and decides if this is the right move for her      Relevant Medications   buPROPion  (WELLBUTRIN XL) 150 MG 24 hr tablet   Insomnia    Chronic, stable, uncontrolled Continue trazodone as needed      GAD (generalized anxiety disorder) - Primary    Chronic and uncontrolled Previously well controlled on higher dose of Paxil Minimal improvement since resuming Paxil As she is having decreased libido from Paxil, and no increase in dose at this time Add Wellbutrin as adjunct as above Contracted for safety Discussed importance of therapy Follow-up in 1 month and repeat PHQ-9 and GAD-7      Relevant Medications   buPROPion (WELLBUTRIN XL) 150 MG 24 hr tablet       Return in about 4 weeks (around 03/23/2020) for MDD/GAD f/u, virtual.     I discussed the assessment and treatment plan with the patient. The patient was provided an opportunity to ask questions and all were answered. The patient agreed with the plan and demonstrated an understanding of the instructions.  The patient was advised to call back or seek an in-person evaluation if the symptoms worsen or if the condition fails to improve as anticipated.  I, Lavon Paganini, MD, have reviewed all documentation for this visit. The documentation on 02/24/20 for the exam, diagnosis, procedures, and orders are all accurate and complete.   Olan Kurek, Dionne Bucy, MD, MPH [facility]

## 2020-02-24 NOTE — Assessment & Plan Note (Signed)
Chronic, stable, uncontrolled Continue trazodone as needed

## 2020-02-24 NOTE — Assessment & Plan Note (Signed)
Chronic and uncontrolled Minimal improvement on Paxil She had previously been well controlled on higher dose of Paxil Given that she is having some decreased libido with Paxil, we will not increase the dose currently Instead, add adjunct, Wellbutrin XL 150 mg daily Discussed possible side effects She has tolerated it well previously Contracted for safety-no SI/HI Discussed the synergistic effect of therapy with medications Follow-up in 1 month virtually and repeat PHQ-9 and GAD-7 Patient is contemplating FMLA leave and I will be willing to fill out paperwork for leave for 2 weeks after she talks to her boss and decides if this is the right move for her

## 2020-02-24 NOTE — Assessment & Plan Note (Signed)
Chronic and uncontrolled Previously well controlled on higher dose of Paxil Minimal improvement since resuming Paxil As she is having decreased libido from Paxil, and no increase in dose at this time Add Wellbutrin as adjunct as above Contracted for safety Discussed importance of therapy Follow-up in 1 month and repeat PHQ-9 and GAD-7

## 2020-05-15 ENCOUNTER — Other Ambulatory Visit: Payer: Self-pay

## 2020-05-15 ENCOUNTER — Encounter: Payer: Self-pay | Admitting: Family Medicine

## 2020-05-15 ENCOUNTER — Ambulatory Visit (INDEPENDENT_AMBULATORY_CARE_PROVIDER_SITE_OTHER): Payer: 59 | Admitting: Family Medicine

## 2020-05-15 VITALS — BP 113/78 | HR 69 | Temp 98.3°F | Resp 16 | Ht 64.0 in | Wt 145.0 lb

## 2020-05-15 DIAGNOSIS — K5904 Chronic idiopathic constipation: Secondary | ICD-10-CM

## 2020-05-15 DIAGNOSIS — R1013 Epigastric pain: Secondary | ICD-10-CM | POA: Diagnosis not present

## 2020-05-15 MED ORDER — OMEPRAZOLE 40 MG PO CPDR
40.0000 mg | DELAYED_RELEASE_CAPSULE | Freq: Every day | ORAL | 3 refills | Status: DC
Start: 2020-05-15 — End: 2021-12-02

## 2020-05-15 NOTE — Progress Notes (Signed)
Established patient visit   Patient: Ebony Steele   DOB: [birth year]   44 y.o. Female  MRN: [id number] Visit Date: 05/15/2020  Today's healthcare provider: Lavon Paganini, MD   Chief Complaint  Patient presents with  . Abdominal Pain   Subjective    HPI   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 For the past six months she had burning pain in the upper abdomen accompanied by belching. She was 99% sure she had an ulcer so she took Prilosec which relieved the burning.  For the past two months the burning has not been as bad, but she is having more RLQ stabbing pain, nausea, and feeling as if her stomach is twisting. She has tried her boyfriend's pepcid which helps with the pain/burning, but makes her not want to eat. She reports some NSAID use.  She has been going to the bathroom more with more looser stools that smell worse than normal. She usually has a bowel movement 1-2/week and has to work hard.These stools are are usually small balls. She has not noticed any blood in her stools and no black/tarry stools.  Right now the pain is a 2-3/10 (a subtle, light burn). At it's worst the pain is a 10/10. She has not noticed any specific food triggers, and tries to avoid spicy, fatty, greasy, and fried foods. She has no known food allergies/intollerances.  Her period started yesterday, and she has had cramps and looser stools. She has taken advil for the cramps.   Patient Active Problem List   Diagnosis Date Noted  . GAD (generalized anxiety disorder) 01/13/2020  . Concentration deficit 01/13/2020  . Abnormal chest CT 09/18/2015  . Cyst of ovary 09/17/2015  . Allergic rhinitis 03/09/2015  . Absolute anemia 03/09/2015  . Headache disorder 03/09/2015  . CN (constipation) 03/09/2015  . Clinical depression 03/09/2015  . Esophageal reflux 03/09/2015  . Insomnia 03/09/2015    . Hypothyroidism 04/19/2007  . Abnormal blood sugar 04/13/2007  . Big thyroid 04/13/2007  . Current tobacco use 04/13/2007   Social History   Tobacco Use  . Smoking status: Light Tobacco Smoker    Types: Cigars  . Smokeless tobacco: Never Used  . Tobacco comment: Smoked 1 cigar per day  Vaping Use  . Vaping Use: Never used  Substance Use Topics  . Alcohol use: Yes    Comment: Occasional Wine  . Drug use: No    Types: Marijuana    Comment: Pt reports using Marijuana to help with depression symptoms.  She stopped  the end of May 2018       Medications: Outpatient Medications Prior to Visit  Medication Sig  . buPROPion (WELLBUTRIN XL) 150 MG 24 hr tablet Take 1 tablet (150 mg total) by mouth daily.  Marland Kitchen levothyroxine (SYNTHROID) 88 MCG tablet Take 1 tablet (88 mcg total) by mouth daily.  . traZODone (DESYREL) 50 MG tablet Take 1-2 tablets (50-100 mg total) by mouth at bedtime as needed for sleep.  . [DISCONTINUED] PARoxetine (PAXIL) 20 MG tablet Take 1 tablet (20 mg total) by mouth daily. (Patient not taking: Reported on 05/15/2020)   No facility-administered medications prior to visit.    Review of Systems  Constitutional: Positive for appetite change and chills. Negative for fever and unexpected weight change.  Gastrointestinal: Positive for abdominal distention, abdominal pain, constipation and diarrhea. Negative for anal  bleeding, blood in stool, nausea and vomiting.  Musculoskeletal: Positive for arthralgias.    Last CBC Lab Results  Component Value Date   WBC 4.1 06/29/2018   HGB 13.2 06/29/2018   HCT 40.0 06/29/2018   MCV 86 06/29/2018   MCH 28.4 06/29/2018   RDW 12.2 (L) 06/29/2018   PLT 232 25/00/3704   Last metabolic panel Lab Results  Component Value Date   GLUCOSE 79 06/29/2018   NA 141 06/29/2018   K 3.9 06/29/2018   CL 107 (H) 06/29/2018   CO2 21 06/29/2018   BUN 8 06/29/2018   CREATININE 0.81 06/29/2018   GFRNONAA 90 06/29/2018   GFRAA 104  06/29/2018   CALCIUM 9.3 06/29/2018   PROT 7.0 06/29/2018   ALBUMIN 4.3 06/29/2018   LABGLOB 2.7 06/29/2018   AGRATIO 1.6 06/29/2018   BILITOT <0.2 06/29/2018   ALKPHOS 84 06/29/2018   AST 15 06/29/2018   ALT 9 06/29/2018   Last lipids Lab Results  Component Value Date   CHOL 148 06/29/2018   HDL 55 06/29/2018   LDLCALC 76 06/29/2018   TRIG 86 06/29/2018   CHOLHDL 2.7 06/29/2018   Last thyroid functions Lab Results  Component Value Date   TSH 3.140 01/13/2020      Objective    BP 113/78 (BP Location: Left Arm, Patient Position: Sitting, Cuff Size: Normal)   Pulse 69   Temp 98.3 F (36.8 C) (Oral)   Resp 16   Ht 5\' 4"  (1.626 m)   Wt 145 lb (65.8 kg)   BMI 24.89 kg/m     Physical Exam Constitutional:      General: She is not in acute distress.    Appearance: She is normal weight. She is not ill-appearing, toxic-appearing or diaphoretic.  HENT:     Head: Normocephalic and atraumatic.  Cardiovascular:     Rate and Rhythm: Normal rate and regular rhythm.     Heart sounds: Normal heart sounds. No murmur heard.  No friction rub. No gallop.   Pulmonary:     Effort: Pulmonary effort is normal. No respiratory distress.     Breath sounds: Normal breath sounds. No wheezing.  Abdominal:     General: Bowel sounds are normal. There is distension. There are no signs of injury.     Palpations: Abdomen is soft. There is no fluid wave, hepatomegaly, splenomegaly or mass.     Tenderness: There is abdominal tenderness in the right upper quadrant, right lower quadrant, epigastric area and left upper quadrant. There is guarding.     Hernia: There is no hernia in the umbilical area.  Skin:    General: Skin is warm and dry.  Neurological:     General: No focal deficit present.     Mental Status: She is alert and oriented to person, place, and time. Mental status is at baseline.  Psychiatric:        Mood and Affect: Mood normal.        Behavior: Behavior normal.        Thought  Content: Thought content normal.        Judgment: Judgment normal.     Assessment & Plan     1. Epigastric abdominal pain This burning pain that sometimes causes [name] to be doubled is likely on the gastritis and PUD spectrum. This is further supported by previous NSAID use and relief with prilosec OTC.  Plan: - Start Prilosec 40mg  once daily - need to consider H Pylori testing and  GI referral if not improving with higher dose PPI - no bleeding noted by patient, but check CBC - avoid NSAIDs - bland diet   2. Chronic idiopathic constipation This is likely the cause of her lower abdominal, shooting/stabbing pain.  Plan: - Comprehensive metabolic panel - CBC w/Diff/Platelet - TSH - Start miralax 1 cap/day with the goal of 1 soft stool per day - return precautions discussed   Return if symptoms worsen or fail to improve.      Rodrigo Ran, MS3  Total time spent on today's visit was greater than 30 minutes, including both face-to-face time and nonface-to-face time personally spent on review of chart (labs and imaging), discussing labs and goals, discussing further work-up, treatment options, referrals to specialist if needed, reviewing outside records of pertinent, answering patient's questions, and coordinating care.    Patient seen along with MS3 student Rodrigo Ran. I personally evaluated this patient along with the student, and verified all aspects of the history, physical exam, and medical decision making as documented by the student. I agree with the student's documentation and have made all necessary edits.  Suleman Gunning, Dionne Bucy, MD, MPH [facility]

## 2020-05-15 NOTE — Patient Instructions (Signed)
Bland Diet A bland diet consists of foods that are often soft and do not have a lot of fat, fiber, or extra seasonings. Foods without fat, fiber, or seasoning are easier for the body to digest. They are also less likely to irritate your mouth, throat, stomach, and other parts of your digestive system. A bland diet is sometimes called a BRAT diet. What is my plan? Your health care provider or food and nutrition specialist (dietitian) may recommend specific changes to your diet to prevent symptoms or to treat your symptoms. These changes may include:  Eating small meals often.  Cooking food until it is soft enough to chew easily.  Chewing your food well.  Drinking fluids slowly.  Not eating foods that are very spicy, sour, or fatty.  Not eating citrus fruits, such as oranges and grapefruit. What do I need to know about this diet?  Eat a variety of foods from the bland diet food list.  Do not follow a bland diet longer than needed.  Ask your health care provider whether you should take vitamins or supplements. What foods can I eat? Grains  Hot cereals, such as cream of wheat. Rice. Bread, crackers, or tortillas made from refined white flour. Vegetables Canned or cooked vegetables. Mashed or boiled potatoes. Fruits  Bananas. Applesauce. Other types of cooked or canned fruit with the skin and seeds removed, such as canned peaches or pears. Meats and other proteins  Scrambled eggs. Creamy peanut butter or other nut butters. Lean, well-cooked meats, such as chicken or fish. Tofu. Soups or broths. Dairy Low-fat dairy products, such as milk, cottage cheese, or yogurt. Beverages  Water. Herbal tea. Apple juice. Fats and oils Mild salad dressings. Canola or olive oil. Sweets and desserts Pudding. Custard. Fruit gelatin. Ice cream. The items listed above may not be a complete list of recommended foods and beverages. Contact a dietitian for more options. What foods are not  recommended? Grains Whole grain breads and cereals. Vegetables Raw vegetables. Fruits Raw fruits, especially citrus, berries, or dried fruits. Dairy Whole fat dairy foods. Beverages Caffeinated drinks. Alcohol. Seasonings and condiments Strongly flavored seasonings or condiments. Hot sauce. Salsa. Other foods Spicy foods. Fried foods. Sour foods, such as pickled or fermented foods. Foods with high sugar content. Foods high in fiber. The items listed above may not be a complete list of foods and beverages to avoid. Contact a dietitian for more information. Summary  A bland diet consists of foods that are often soft and do not have a lot of fat, fiber, or extra seasonings.  Foods without fat, fiber, or seasoning are easier for the body to digest.  Check with your health care provider to see how long you should follow this diet plan. It is not meant to be followed for long periods. This information is not intended to replace advice given to you by your health care provider. Make sure you discuss any questions you have with your health care provider. Document Revised: 09/16/2017 Document Reviewed: 09/16/2017 Elsevier Patient Education  2020 Elsevier Inc.  

## 2020-05-16 LAB — COMPREHENSIVE METABOLIC PANEL
ALT: 15 IU/L (ref 0–32)
AST: 16 IU/L (ref 0–40)
Albumin/Globulin Ratio: 1.5 (ref 1.2–2.2)
Albumin: 4.2 g/dL (ref 3.8–4.8)
Alkaline Phosphatase: 73 IU/L (ref 44–121)
BUN/Creatinine Ratio: 9 (ref 9–23)
BUN: 8 mg/dL (ref 6–24)
Bilirubin Total: 0.3 mg/dL (ref 0.0–1.2)
CO2: 19 mmol/L — ABNORMAL LOW (ref 20–29)
Calcium: 8.9 mg/dL (ref 8.7–10.2)
Chloride: 108 mmol/L — ABNORMAL HIGH (ref 96–106)
Creatinine, Ser: 0.85 mg/dL (ref 0.57–1.00)
GFR calc Af Amer: 96 mL/min/{1.73_m2} (ref >59)
GFR calc non Af Amer: 84 mL/min/{1.73_m2} (ref >59)
Globulin, Total: 2.8 g/dL (ref 1.5–4.5)
Glucose: 79 mg/dL (ref 65–99)
Potassium: 4.3 mmol/L (ref 3.5–5.2)
Sodium: 140 mmol/L (ref 134–144)
Total Protein: 7 g/dL (ref 6.0–8.5)

## 2020-05-16 LAB — CBC WITH DIFFERENTIAL/PLATELET
Basophils Absolute: 0.1 10*3/uL (ref 0.0–0.2)
Basos: 1 %
EOS (ABSOLUTE): 0.2 10*3/uL (ref 0.0–0.4)
Eos: 6 %
Hematocrit: 40.1 % (ref 34.0–46.6)
Hemoglobin: 12.8 g/dL (ref 11.1–15.9)
Immature Grans (Abs): 0 10*3/uL (ref 0.0–0.1)
Immature Granulocytes: 0 %
Lymphocytes Absolute: 2.2 10*3/uL (ref 0.7–3.1)
Lymphs: 53 %
MCH: 28.4 pg (ref 26.6–33.0)
MCHC: 31.9 g/dL (ref 31.5–35.7)
MCV: 89 fL (ref 79–97)
Monocytes Absolute: 0.5 10*3/uL (ref 0.1–0.9)
Monocytes: 11 %
Neutrophils Absolute: 1.2 10*3/uL — ABNORMAL LOW (ref 1.4–7.0)
Neutrophils: 29 %
Platelets: 181 10*3/uL (ref 150–450)
RBC: 4.5 x10E6/uL (ref 3.77–5.28)
RDW: 12.8 % (ref 11.7–15.4)
WBC: 4.2 10*3/uL (ref 3.4–10.8)

## 2020-05-16 LAB — TSH: TSH: 2.73 u[IU]/mL (ref 0.450–4.500)

## 2020-05-18 ENCOUNTER — Telehealth: Payer: Self-pay

## 2020-05-18 NOTE — Telephone Encounter (Signed)
Left message advising pt (per DPR).   Thanks,   -Mickel Baas

## 2020-05-18 NOTE — Telephone Encounter (Signed)
-----   Message from Virginia Crews, MD sent at 05/18/2020 11:21 AM EDT ----- Normal labs

## 2020-09-10 ENCOUNTER — Other Ambulatory Visit: Payer: Self-pay

## 2020-09-10 ENCOUNTER — Encounter: Payer: Self-pay | Admitting: Family Medicine

## 2020-09-10 ENCOUNTER — Ambulatory Visit (INDEPENDENT_AMBULATORY_CARE_PROVIDER_SITE_OTHER): Payer: 59 | Admitting: Family Medicine

## 2020-09-10 VITALS — BP 144/90 | HR 94 | Temp 98.4°F | Ht 64.0 in | Wt 138.2 lb

## 2020-09-10 DIAGNOSIS — D649 Anemia, unspecified: Secondary | ICD-10-CM | POA: Diagnosis not present

## 2020-09-10 DIAGNOSIS — Z0000 Encounter for general adult medical examination without abnormal findings: Secondary | ICD-10-CM | POA: Diagnosis not present

## 2020-09-10 DIAGNOSIS — R69 Illness, unspecified: Secondary | ICD-10-CM | POA: Diagnosis not present

## 2020-09-10 DIAGNOSIS — Z1159 Encounter for screening for other viral diseases: Secondary | ICD-10-CM | POA: Diagnosis not present

## 2020-09-10 DIAGNOSIS — Z1231 Encounter for screening mammogram for malignant neoplasm of breast: Secondary | ICD-10-CM | POA: Diagnosis not present

## 2020-09-10 DIAGNOSIS — R030 Elevated blood-pressure reading, without diagnosis of hypertension: Secondary | ICD-10-CM | POA: Diagnosis not present

## 2020-09-10 DIAGNOSIS — Z720 Tobacco use: Secondary | ICD-10-CM | POA: Diagnosis not present

## 2020-09-10 DIAGNOSIS — F411 Generalized anxiety disorder: Secondary | ICD-10-CM | POA: Diagnosis not present

## 2020-09-10 DIAGNOSIS — R45851 Suicidal ideations: Secondary | ICD-10-CM

## 2020-09-10 DIAGNOSIS — F331 Major depressive disorder, recurrent, moderate: Secondary | ICD-10-CM | POA: Diagnosis not present

## 2020-09-10 DIAGNOSIS — R590 Localized enlarged lymph nodes: Secondary | ICD-10-CM | POA: Insufficient documentation

## 2020-09-10 DIAGNOSIS — E039 Hypothyroidism, unspecified: Secondary | ICD-10-CM | POA: Diagnosis not present

## 2020-09-10 MED ORDER — DULOXETINE HCL 30 MG PO CPEP
30.0000 mg | ORAL_CAPSULE | Freq: Every day | ORAL | 3 refills | Status: DC
Start: 2020-09-10 — End: 2020-12-25

## 2020-09-10 NOTE — Assessment & Plan Note (Signed)
Associated with [employer]D No firearms in the home Contracted for safety Started on duloxetine 30 mg

## 2020-09-10 NOTE — Assessment & Plan Note (Addendum)
Endorses anxiety and depression Smoked cigarettes this morning No recent high blood pressures

## 2020-09-10 NOTE — Assessment & Plan Note (Addendum)
Chronic and uncontrolled Worsening in last month Endorses anhedonia, anorexia, feelings of sadness and passive SI Failed Zoloft, Prozac and Zyprexa Continues taking Wellbutrin 150 daily Start Cymbalta 30 mg  FMLA leave for 2 weeks Contracted for safety. Does no qualify for inpatient treatment at this time Referred to social work for frequent check ins F/u in 2 weeks

## 2020-09-10 NOTE — Assessment & Plan Note (Signed)
Taking Synthroid Worsening anxiety and depression Recheck TSH

## 2020-09-10 NOTE — Patient Instructions (Signed)
Preventive Care 84-45 Years Old, Female Preventive care refers to lifestyle choices and visits with your health care provider that can promote health and wellness. This includes:  A yearly physical exam. This is also called an annual wellness visit.  Regular dental and eye exams.  Immunizations.  Screening for certain conditions.  Healthy lifestyle choices, such as: ? Eating a healthy diet. ? Getting regular exercise. ? Not using drugs or products that contain nicotine and tobacco. ? Limiting alcohol use. What can I expect for my preventive care visit? Physical exam Your health care provider will check your:  Height and weight. These may be used to calculate your BMI (body mass index). BMI is a measurement that tells if you are at a healthy weight.  Heart rate and blood pressure.  Body temperature.  Skin for abnormal spots. Counseling Your health care provider may ask you questions about your:  Past medical problems.  Family's medical history.  Alcohol, tobacco, and drug use.  Emotional well-being.  Home life and relationship well-being.  Sexual activity.  Diet, exercise, and sleep habits.  Work and work Statistician.  Access to firearms.  Method of birth control.  Menstrual cycle.  Pregnancy history. What immunizations do I need? Vaccines are usually given at various ages, according to a schedule. Your health care provider will recommend vaccines for you based on your age, medical history, and lifestyle or other factors, such as travel or where you work.   What tests do I need? Blood tests  Lipid and cholesterol levels. These may be checked every 5 years, or more often if you are over 3 years old.  Hepatitis C test.  Hepatitis B test. Screening  Lung cancer screening. You may have this screening every year starting at age 73 if you have a 30-pack-year history of smoking and currently smoke or have quit within the past 15 years.  Colorectal cancer  screening. ? All adults should have this screening starting at age 52 and continuing until age 17. ? Your health care provider may recommend screening at age 49 if you are at increased risk. ? You will have tests every 1-10 years, depending on your results and the type of screening test.  Diabetes screening. ? This is done by checking your blood sugar (glucose) after you have not eaten for a while (fasting). ? You may have this done every 1-3 years.  Mammogram. ? This may be done every 1-2 years. ? Talk with your health care provider about when you should start having regular mammograms. This may depend on whether you have a family history of breast cancer.  BRCA-related cancer screening. This may be done if you have a family history of breast, ovarian, tubal, or peritoneal cancers.  Pelvic exam and Pap test. ? This may be done every 3 years starting at age 10. ? Starting at age 11, this may be done every 5 years if you have a Pap test in combination with an HPV test. Other tests  STD (sexually transmitted disease) testing, if you are at risk.  Bone density scan. This is done to screen for osteoporosis. You may have this scan if you are at high risk for osteoporosis. Talk with your health care provider about your test results, treatment options, and if necessary, the need for more tests. Follow these instructions at home: Eating and drinking  Eat a diet that includes fresh fruits and vegetables, whole grains, lean protein, and low-fat dairy products.  Take vitamin and mineral supplements  as recommended by your health care provider.  Do not drink alcohol if: ? Your health care provider tells you not to drink. ? You are pregnant, may be pregnant, or are planning to become pregnant.  If you drink alcohol: ? Limit how much you have to 0-1 drink a day. ? Be aware of how much alcohol is in your drink. In the [place] one drink equals one 12 oz bottle of beer (355 mL), one 5 oz glass of  wine (148 mL), or one 1 oz glass of hard liquor (44 mL).   Lifestyle  Take daily care of your teeth and gums. Brush your teeth every morning and night with fluoride toothpaste. Floss one time each day.  Stay active. Exercise for at least 30 minutes 5 or more days each week.  Do not use any products that contain nicotine or tobacco, such as cigarettes, e-cigarettes, and chewing tobacco. If you need help quitting, ask your health care provider.  Do not use drugs.  If you are sexually active, practice safe sex. Use a condom or other form of protection to prevent STIs (sexually transmitted infections).  If you do not wish to become pregnant, use a form of birth control. If you plan to become pregnant, see your health care provider for a prepregnancy visit.  If told by your health care provider, take low-dose aspirin daily starting at age 50.  Find healthy ways to cope with stress, such as: ? Meditation, yoga, or listening to music. ? Journaling. ? Talking to a trusted person. ? Spending time with friends and family. Safety  Always wear your seat belt while driving or riding in a vehicle.  Do not drive: ? If you have been drinking alcohol. Do not ride with someone who has been drinking. ? When you are tired or distracted. ? While texting.  Wear a helmet and other protective equipment during sports activities.  If you have firearms in your house, make sure you follow all gun safety procedures. What's next?  Visit your health care provider once a year for an annual wellness visit.  Ask your health care provider how often you should have your eyes and teeth checked.  Stay up to date on all vaccines. This information is not intended to replace advice given to you by your health care provider. Make sure you discuss any questions you have with your health care provider. Document Revised: 05/22/2020 Document Reviewed: 04/29/2018 Elsevier Patient Education  2021 Elsevier Inc.  

## 2020-09-10 NOTE — Assessment & Plan Note (Signed)
Restarted smoking Understands its affect on her health, uses it as a coping mechanism for the depression

## 2020-09-10 NOTE — Assessment & Plan Note (Signed)
No recent infection or vaccine in R arm Unilateral axillary LAD Diagnostic mammo and [place] to evaluate further

## 2020-09-10 NOTE — Assessment & Plan Note (Signed)
Chronic and uncontrolled assosciated with MDD Continue wellbutrin 150 Start Cymbalta 30  Contracted for safety Patient is in therapy Continue prazosin PRN F/u in 2 weeks with GAD-7 and PHQ-9

## 2020-09-10 NOTE — Progress Notes (Signed)
Complete physical exam   Patient: Ebony Steele   DOB: May 27, 1976   45 y.o. Female  MRN: [id number] Visit Date: 09/10/2020  Today's healthcare provider: Lavon Paganini, MD   Chief Complaint  Patient presents with  . Annual Exam   Subjective    Ebony Steele is a 45 y.o. female who presents today for a complete physical exam.   She reports consuming a general diet. The patient does not participate in regular exercise at present. She generally feels poorly. She reports sleeping poorly. She does have additional problems to discuss today.   HPI   Ranelle has been struggling with her mood. She says her depression has her to "near a breaking point" and she does not feel that her current treatment regimen is working. She endorses anhedonia, insomnia, anorexia and feeling of sadness in addition to passive suicidal ideation. She has not formed a plan, but states "I chose not to buy a gun to own, because I know I would use it on myself." She notes that her coping mechanism for suicidal thoughts is drinking and 'talking herself out of it', with an emphasis on the value of her family as a reason to stay alive.However, when she tries to share her problems, she does not feel well supported by her family. She is currently seeing a therapist and is trying to increase her frequency. She states that she feels her anxiety is worsened by her mood, but she can take trazadone to 'help take the edge off' when it gets particularly bad. She has considered checking herself into a hospital or taking time off of work, but has not acted on these intentions because she is worried about its affect on her image and the responsibility she has to  her daughter. She denies fevers, chills, N/V but endorses occasional diarrhea with her anxiety.     Past Medical History:  Diagnosis Date  . Asthma   . BV (bacterial vaginosis)   . Hypertension   . Menometrorrhagia   . Thyroid disease    Past Surgical History:   Procedure Laterality Date  . DILATION AND CURETTAGE OF UTERUS  2005  . LAPAROSCOPY  2000  . OOPHORECTOMY Right 2000   [place]  . TUBAL LIGATION     Social History   Socioeconomic History  . Marital status: Divorced    Spouse name: Not on file  . Number of children: 3  . Years of education: College  . Highest education level: Not on file  Occupational History  . 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 Smoking status: Current Every Day Smoker    Types: Cigars  . Smokeless tobacco: Never Used  . Tobacco comment: Smoked 1 cigar per day  Vaping Use  . Vaping Use: Never used  Substance and Sexual Activity  . Alcohol use: Yes    Comment: Occasional Wine  . Drug use: No    Types: Marijuana    Comment: Pt reports using Marijuana to help with depression symptoms.  She stopped  the end of May 2018  . Sexual activity: Yes    Birth control/protection: I.U.D.    Comment: Mirena  Other Topics Concern  . Not on file  Social History Narrative  . Not on file   Social Determinants of Health   Financial Resource Strain: Not on file  Food Insecurity: Not on file  Transportation Needs: Not on file  Physical Activity: Not on  file  Stress: Not on file  Social Connections: Not on file  Intimate Partner Violence: Not on file   Family Status  Relation Name Status  . Mother  Alive  . Father  Deceased  . Brother  Alive   Family History  Problem Relation Age of Onset  . Diabetes Mother   . Depression Mother   . Hypertension Mother   . Glaucoma Mother   . Kidney disease Father   . Diabetes Father   . Diabetes Mellitus I Brother    Allergies  Allergen Reactions  . Acetaminophen     Patient Care Team: Virginia Crews, MD as PCP - General (Family Medicine)   Medications: Outpatient Medications Prior to Visit  Medication Sig  . buPROPion (WELLBUTRIN XL) 150 MG 24 hr tablet Take 1 tablet (150 mg total) by mouth daily.  Marland Kitchen  levothyroxine (SYNTHROID) 88 MCG tablet Take 1 tablet (88 mcg total) by mouth daily.  Marland Kitchen omeprazole (PRILOSEC) 40 MG capsule Take 1 capsule (40 mg total) by mouth daily.  . traZODone (DESYREL) 50 MG tablet Take 1-2 tablets (50-100 mg total) by mouth at bedtime as needed for sleep.   No facility-administered medications prior to visit.    Review of Systems  Constitutional: Negative.   HENT: Negative.   Eyes: Negative.   Respiratory: Negative.   Cardiovascular: Negative.   Gastrointestinal: Negative.   Endocrine: Negative.   Genitourinary: Negative.   Musculoskeletal: Negative.   Skin: Negative.   Allergic/Immunologic: Negative.   Neurological: Negative.   Hematological: Negative.   Psychiatric/Behavioral: Positive for agitation, decreased concentration, dysphoric mood, sleep disturbance and suicidal ideas. The patient is nervous/anxious.       Objective    BP (!) 144/90 (BP Location: Right Arm, Patient Position: Sitting, Cuff Size: Large)   Pulse 94   Temp 98.4 F (36.9 C) (Oral)   Ht 5\' 4"  (1.626 m)   Wt 138 lb 3.2 oz (62.7 kg)   BMI 23.72 kg/m    Physical Exam   General: Brightens upon approach. Well nourished, in NAD Lungs: CTA bilaterally, normal work of breathing Cards: RRR no murmurs rubs or gallops HEENT: Supple neck, no cervical lymphadenopathy, PERRL, Tympanic membranes clear.  GI: Soft Non-Tender Lymph: 1cm enlarged, mobile, tender lymph node in R axilla. Psych: Alert and Oriented  Appearance: Well groomed, sitting upright, making direct eye contact  Mood: "Bad"  Affect: Dysthymic, mood congruent, animated and tearful when discussing insights about her feelings.  Speech: fast, articulated with appropriate paced tenses  Conversation: Clear, Coherent  Thought process: logical and linear  Thought content: Endorses passive SI, hears the sounds of her dead cat occassionally in the house. No  visual hallucinations.   Judgement: Intact  Insight: Intact  Impulse  control: Intact    Last depression screening scores PHQ 2/9 Scores 09/10/2020 02/24/2020 01/13/2020  PHQ - 2 Score 6 5 6   PHQ- 9 Score 24 20 23    Last fall risk screening Fall Risk  09/10/2020  Falls in the past year? 0  Number falls in past yr: 0  Injury with Fall? 0  Risk for fall due to : -  Follow up -   Last Audit-C alcohol use screening Alcohol Use Disorder Test (AUDIT) 09/10/2020  1. How often do you have a drink containing alcohol? 3  2. How many drinks containing alcohol do you have on a typical day when you are drinking? 0  3. How often do you have six or more drinks on  one occasion? 0  AUDIT-C Score 3  Alcohol Brief Interventions/Follow-up -   A score of 3 or more in women, and 4 or more in men indicates increased risk for alcohol abuse, EXCEPT if all of the points are from question 1   No results found for any visits on 09/10/20.  Assessment & Plan    Routine Health Maintenance and Physical Exam  Exercise Activities and Dietary recommendations Goals    . Exercise 150 minutes per week (moderate activity)       Immunization History  Administered Date(s) Administered  . Influenza-Unspecified 06/30/2018  . Moderna Sars-Covid-2 Vaccination 09/12/2019, 10/10/2019, 08/16/2020  . Tdap 01/28/2012    Health Maintenance  Topic Date Due  . Hepatitis C Screening  Never done  . INFLUENZA VACCINE  11/29/2020 (Originally 04/01/2020)  . TETANUS/TDAP  01/27/2022  . PAP SMEAR-Modifier  03/17/2022  . COVID-19 Vaccine  Completed  . HIV Screening  Completed    Discussed health benefits of physical activity, and encouraged her to engage in regular exercise appropriate for her age and condition.  Problem List Items Addressed This Visit      Endocrine   Hypothyroidism    Taking Synthroid Worsening anxiety and depression Recheck TSH      Relevant Orders   TSH     Immune and Lymphatic   Axillary lymphadenopathy    No recent infection or vaccine in R arm Unilateral  axillary LAD Diagnostic mammo and [place] to evaluate further      Relevant Orders   US BREAST COMPLETE UNI RIGHT INC AXILLA   MM DIAG BREAST TOMO UNI RIGHT     Other   Absolute anemia    Recheck CBC      Relevant Orders   CBC w/Diff/Platelet   Clinical depression    Chronic and uncontrolled Worsening in last month Endorses anhedonia, anorexia, feelings of sadness and passive SI Failed Zoloft, Prozac and Zyprexa Continues taking Wellbutrin 150 daily Start Cymbalta 30 mg  FMLA leave for 2 weeks Contracted for safety. Does no qualify for inpatient treatment at this time Referred to social work for frequent check ins F/u in 2 weeks      Relevant Medications   DULoxetine (CYMBALTA) 30 MG capsule   Other Relevant Orders   Ambulatory referral to Social Work   Current tobacco use    Restarted smoking Understands its affect on her health, uses it as a coping mechanism for the depression      GAD (generalized anxiety disorder)    Chronic and uncontrolled assosciated with MDD Continue wellbutrin 150 Start Cymbalta 30  Contracted for safety Patient is in therapy Continue prazosin PRN F/u in 2 weeks with GAD-7 and PHQ-9      Relevant Medications   DULoxetine (CYMBALTA) 30 MG capsule   Other Relevant Orders   Ambulatory referral to Social Work   Passive suicidal ideations    Associated with MDD No firearms in the home Contracted for safety Started on duloxetine 30 mg      Relevant Orders   Ambulatory referral to Social Work   Elevated BP without diagnosis of hypertension    Endorses anxiety and depression Smoked cigarettes this morning No recent high blood pressures      Need for hepatitis C screening test   Relevant Orders   Hepatitis C Antibody   Encounter for annual physical exam - Primary   Relevant Orders   Hepatitis C Antibody   Lipid panel   Comprehensive metabolic panel  CBC w/Diff/Platelet   TSH    Other Visit Diagnoses    Screening mammogram for  breast cancer       Relevant Orders   MM 3D SCREEN BREAST UNI LEFT       Return in about 2 weeks (around 09/24/2020) for MDD/GAD f/u, virtual ok.     Patient seen along with MS[employer] student [facility]. I personally evaluated this patient along with the student, and verified all aspects of the history, physical exam, and medical decision making as documented by the student. I agree with the student's documentation and have made all necessary edits.  Selah Klang, Dionne Bucy, MD, MPH [facility]

## 2020-09-10 NOTE — Assessment & Plan Note (Signed)
Recheck CBC. 

## 2020-09-11 LAB — LIPID PANEL
Chol/HDL Ratio: 2.7 ratio (ref 0.0–4.4)
Cholesterol, Total: 164 mg/dL (ref 100–199)
HDL: 60 mg/dL (ref >39)
LDL Chol Calc (NIH): 90 mg/dL (ref 0–99)
Triglycerides: 74 mg/dL (ref 0–149)
VLDL Cholesterol Cal: 14 mg/dL (ref 5–40)

## 2020-09-11 LAB — CBC WITH DIFFERENTIAL/PLATELET
Basophils Absolute: 0.1 10*3/uL (ref 0.0–0.2)
Basos: 1 %
EOS (ABSOLUTE): 0.2 10*3/uL (ref 0.0–0.4)
Eos: 3 %
Hematocrit: 39.9 % (ref 34.0–46.6)
Hemoglobin: 13.8 g/dL (ref 11.1–15.9)
Immature Grans (Abs): 0 10*3/uL (ref 0.0–0.1)
Immature Granulocytes: 0 %
Lymphocytes Absolute: 2.2 10*3/uL (ref 0.7–3.1)
Lymphs: 47 %
MCH: 29.1 pg (ref 26.6–33.0)
MCHC: 34.6 g/dL (ref 31.5–35.7)
MCV: 84 fL (ref 79–97)
Monocytes Absolute: 0.4 10*3/uL (ref 0.1–0.9)
Monocytes: 8 %
Neutrophils Absolute: 1.9 10*3/uL (ref 1.4–7.0)
Neutrophils: 41 %
Platelets: 193 10*3/uL (ref 150–450)
RBC: 4.75 x10E6/uL (ref 3.77–5.28)
RDW: 11.8 % (ref 11.7–15.4)
WBC: 4.7 10*3/uL (ref 3.4–10.8)

## 2020-09-11 LAB — COMPREHENSIVE METABOLIC PANEL
ALT: 14 IU/L (ref 0–32)
AST: 16 IU/L (ref 0–40)
Albumin/Globulin Ratio: 1.6 (ref 1.2–2.2)
Albumin: 4.5 g/dL (ref 3.8–4.8)
Alkaline Phosphatase: 75 IU/L (ref 44–121)
BUN/Creatinine Ratio: 11 (ref 9–23)
BUN: 10 mg/dL (ref 6–24)
Bilirubin Total: 0.3 mg/dL (ref 0.0–1.2)
CO2: 20 mmol/L (ref 20–29)
Calcium: 9.2 mg/dL (ref 8.7–10.2)
Chloride: 107 mmol/L — ABNORMAL HIGH (ref 96–106)
Creatinine, Ser: 0.88 mg/dL (ref 0.57–1.00)
GFR calc Af Amer: 92 mL/min/{1.73_m2} (ref >59)
GFR calc non Af Amer: 80 mL/min/{1.73_m2} (ref >59)
Globulin, Total: 2.8 g/dL (ref 1.5–4.5)
Glucose: 84 mg/dL (ref 65–99)
Potassium: 4.1 mmol/L (ref 3.5–5.2)
Sodium: 139 mmol/L (ref 134–144)
Total Protein: 7.3 g/dL (ref 6.0–8.5)

## 2020-09-11 LAB — HEPATITIS C ANTIBODY: Hep C Virus Ab: <0.1 s/co ratio (ref 0.0–0.9)

## 2020-09-11 LAB — TSH: TSH: 3.19 u[IU]/mL (ref 0.450–4.500)

## 2020-09-24 ENCOUNTER — Encounter: Payer: Self-pay | Admitting: Family Medicine

## 2020-09-24 ENCOUNTER — Telehealth (INDEPENDENT_AMBULATORY_CARE_PROVIDER_SITE_OTHER): Payer: 59 | Admitting: Family Medicine

## 2020-09-24 DIAGNOSIS — F5104 Psychophysiologic insomnia: Secondary | ICD-10-CM

## 2020-09-24 DIAGNOSIS — F411 Generalized anxiety disorder: Secondary | ICD-10-CM

## 2020-09-24 DIAGNOSIS — F331 Major depressive disorder, recurrent, moderate: Secondary | ICD-10-CM | POA: Diagnosis not present

## 2020-09-24 DIAGNOSIS — R69 Illness, unspecified: Secondary | ICD-10-CM | POA: Diagnosis not present

## 2020-09-24 DIAGNOSIS — R45851 Suicidal ideations: Secondary | ICD-10-CM

## 2020-09-24 NOTE — Progress Notes (Signed)
MyChart Video Visit    Virtual Visit via Video Note   This visit type was conducted due to national recommendations for restrictions regarding the COVID-19 Pandemic (e.g. social distancing) in an effort to limit this patient's exposure and mitigate transmission in our community. This patient is at least at moderate risk for complications without adequate follow up. This format is felt to be most appropriate for this patient at this time. Physical exam was limited by quality of the video and audio technology used for the visit.    Patient location: home Provider location: home office Persons involved in the visit: patient, provider  I discussed the limitations of evaluation and management by telemedicine and the availability of in person appointments. The patient expressed understanding and agreed to proceed.  Patient: Ebony Steele   DOB: October 19, 1975   45 y.o. Female  MRN: [id number] Visit Date: 09/24/2020  Today's healthcare provider: Lavon Paganini, MD   Chief Complaint  Patient presents with  . Depression  . Anxiety   Subjective    HPI  Depression, Follow-up  She  was last seen for this 2 weeks ago. Changes made at last visit include start Cymbalta 30 mg and continue Wellbutrin 150 mg daily.   She reports excellent compliance with treatment. She is not having side effects.   She reports excellent tolerance of treatment. Current symptoms include: depressed mood, difficulty concentrating, fatigue, hopelessness and insomnia She feels she is Improved since last visit.  Depression screen [facility] 2/9 09/24/2020 09/10/2020 02/24/2020  Decreased Interest 2 3 3   Down, Depressed, Hopeless 2 3 2   PHQ - 2 Score 4 6 5   Altered sleeping 1 3 2   Tired, decreased energy 1 3 3   Change in appetite 1 2 3   Feeling bad or failure about yourself  0 3 1  Trouble concentrating 2 3 3   Moving slowly or fidgety/restless 0 2 3  Suicidal thoughts 0 2 0  PHQ-9 Score 9 24 20   Difficult doing  work/chores Very difficult Extremely dIfficult Extremely dIfficult    ----------------------------------------------------------------------------------------- Anxiety, Follow-up  She was last seen for anxiety 2 weeks ago. Changes made at last visit include start Cymbalta 30 mg daily continue trazodone PRN.   She reports excellent compliance with treatment. Patient reports having to taking Trazodone 1 1/2 tablet, four (4) nights out of a week. She reports excellent tolerance of treatment. She is not having side effects.  She feels her anxiety is mild and Improved since last visit.  Symptoms: No chest pain Yes difficulty concentrating  No dizziness Yes fatigue  No feelings of losing control Yes insomnia  Yes irritable No palpitations  No panic attacks Yes racing thoughts  No shortness of breath No sweating  No tremors/shakes    GAD-7 Results GAD-7 Generalized Anxiety Disorder Screening Tool 09/24/2020 02/24/2020 01/13/2020  1. Feeling Nervous, Anxious, or on Edge 1 3 3   2. Not Being Able to Stop or Control Worrying 1 3 3   3. Worrying Too Much About Different Things 2 3 3   4. Trouble Relaxing 2 3 3   5. Being So Restless it's Hard To Sit Still 0 3 3  6. Becoming Easily Annoyed or Irritable 2 3 3   7. Feeling Afraid As If Something Awful Might Happen 0 0 1  Total GAD-7 Score 8 18 19   Difficulty At Work, Home, or Getting  Along With Others? Very difficult Extremely difficult Extremely difficult    PHQ-9 Scores PHQ9 SCORE ONLY 09/24/2020 09/10/2020 02/24/2020  PHQ-9  Total Score 9 24 20     Crying spells have decreased. Daughter tells her she is still snappy. Moved Cymbalta from morning to evening, which helped with side effects. No further SI.  Working with therapy. Thinking about switching positions at work to a less stressful environment. ---------------------------------------------------------------------------------------------------   Patient Active Problem List   Diagnosis Date  Noted  . Passive suicidal ideations 09/10/2020  . Elevated BP without diagnosis of hypertension 09/10/2020  . Axillary lymphadenopathy 09/10/2020  . Need for hepatitis C screening test 09/10/2020  . GAD (generalized anxiety disorder) 01/13/2020  . Concentration deficit 01/13/2020  . Abnormal chest CT 09/18/2015  . Cyst of ovary 09/17/2015  . Allergic rhinitis 03/09/2015  . Absolute anemia 03/09/2015  . Headache disorder 03/09/2015  . CN (constipation) 03/09/2015  . Clinical depression 03/09/2015  . Esophageal reflux 03/09/2015  . Insomnia 03/09/2015  . Hypothyroidism 04/19/2007  . Abnormal blood sugar 04/13/2007  . Big thyroid 04/13/2007  . Current tobacco use 04/13/2007   Social History   Tobacco Use  . Smoking status: Current Every Day Smoker    Types: Cigars  . Smokeless tobacco: Never Used  . Tobacco comment: Smoked 1 cigar per day  Vaping Use  . Vaping Use: Never used  Substance Use Topics  . Alcohol use: Yes    Comment: Occasional Wine  . Drug use: No    Types: Marijuana    Comment: Pt reports using Marijuana to help with depression symptoms.  She stopped  the end of May 2018   Allergies  Allergen Reactions  . Acetaminophen       Medications: Outpatient Medications Prior to Visit  Medication Sig  . buPROPion (WELLBUTRIN XL) 150 MG 24 hr tablet Take 1 tablet (150 mg total) by mouth daily.  . DULoxetine (CYMBALTA) 30 MG capsule Take 1 capsule (30 mg total) by mouth daily.  Marland Kitchen levothyroxine (SYNTHROID) 88 MCG tablet Take 1 tablet (88 mcg total) by mouth daily.  Marland Kitchen omeprazole (PRILOSEC) 40 MG capsule Take 1 capsule (40 mg total) by mouth daily.  . traZODone (DESYREL) 50 MG tablet Take 1-2 tablets (50-100 mg total) by mouth at bedtime as needed for sleep.   No facility-administered medications prior to visit.    Review of Systems  Constitutional: Positive for activity change, appetite change and fatigue.  Respiratory: Negative for cough, chest tightness and  shortness of breath.   Cardiovascular: Negative for chest pain and palpitations.  Gastrointestinal: Negative for abdominal pain.  Psychiatric/Behavioral: Positive for decreased concentration, dysphoric mood and sleep disturbance. The patient is nervous/anxious.     Last CBC Lab Results  Component Value Date   WBC 4.7 09/10/2020   HGB 13.8 09/10/2020   HCT 39.9 09/10/2020   MCV 84 09/10/2020   MCH 29.1 09/10/2020   RDW 11.8 09/10/2020   PLT 193 82/50/5397   Last metabolic panel Lab Results  Component Value Date   GLUCOSE 84 09/10/2020   NA 139 09/10/2020   K 4.1 09/10/2020   CL 107 (H) 09/10/2020   CO2 20 09/10/2020   BUN 10 09/10/2020   CREATININE 0.88 09/10/2020   GFRNONAA 80 09/10/2020   GFRAA 92 09/10/2020   CALCIUM 9.2 09/10/2020   PROT 7.3 09/10/2020   ALBUMIN 4.5 09/10/2020   LABGLOB 2.8 09/10/2020   AGRATIO 1.6 09/10/2020   BILITOT 0.3 09/10/2020   ALKPHOS 75 09/10/2020   AST 16 09/10/2020   ALT 14 09/10/2020   Last lipids Lab Results  Component Value Date  CHOL 164 09/10/2020   HDL 60 09/10/2020   LDLCALC 90 09/10/2020   TRIG 74 09/10/2020   CHOLHDL 2.7 09/10/2020      Objective    There were no vitals taken for this visit. BP Readings from Last 3 Encounters:  09/10/20 (!) 144/90  05/15/20 113/78  01/13/20 131/86   Wt Readings from Last 3 Encounters:  09/10/20 138 lb 3.2 oz (62.7 kg)  05/15/20 145 lb (65.8 kg)  01/13/20 141 lb 3.2 oz (64 kg)      Physical Exam Constitutional:      General: She is not in acute distress.    Appearance: Normal appearance.  HENT:     Head: Normocephalic.  Pulmonary:     Effort: Pulmonary effort is normal. No respiratory distress.  Neurological:     Mental Status: She is alert and oriented to person, place, and time. Mental status is at baseline.  Psychiatric:        Behavior: Behavior normal.        Assessment & Plan     Problem List Items Addressed This Visit      Other   Clinical depression     Chronic and uncontrolled, but improving Started Cymbalta 2 weeks ago and seeing some improvement Continue Cymbalta 30 mg daily and Wellbutrin 150 mg daily Passive SI has improved as below Extend FMLA leave for 2 more weeks Contracted for safety Continue to work with therapist Follow-up in 2 weeks      Insomnia    Chronic and uncontrolled Seems related to uncontrolled GAD and MDD Continue trazodone as needed Continue working with therapy      GAD (generalized anxiety disorder) - Primary    Chronic and uncontrolled, but improving Associated with anxiety Improving on Cymbalta over the last 2 weeks as above Continue Wellbutrin 150 mg daily and Cymbalta 30 mg daily Contracted for safety Continue to work with therapist Continue prazosin as needed Follow-up in 2 weeks and repeat GAD-7 and PHQ-9      Passive suicidal ideations    No longer having any SI          Return in about 2 weeks (around 10/08/2020) for MDD/GAD f/u.     I discussed the assessment and treatment plan with the patient. The patient was provided an opportunity to ask questions and all were answered. The patient agreed with the plan and demonstrated an understanding of the instructions.   The patient was advised to call back or seek an in-person evaluation if the symptoms worsen or if the condition fails to improve as anticipated.  I, Lavon Paganini, MD, have reviewed all documentation for this visit. The documentation on 09/24/20 for the exam, diagnosis, procedures, and orders are all accurate and complete.   Dakoda Bassette, Dionne Bucy, MD, MPH [facility]

## 2020-09-24 NOTE — Assessment & Plan Note (Signed)
No longer having any SI

## 2020-09-24 NOTE — Assessment & Plan Note (Signed)
Chronic and uncontrolled Seems related to uncontrolled GAD and MDD Continue trazodone as needed Continue working with therapy

## 2020-09-24 NOTE — Assessment & Plan Note (Signed)
Chronic and uncontrolled, but improving Associated with anxiety Improving on Cymbalta over the last 2 weeks as above Continue Wellbutrin 150 mg daily and Cymbalta 30 mg daily Contracted for safety Continue to work with therapist Continue prazosin as needed Follow-up in 2 weeks and repeat GAD-7 and PHQ-9

## 2020-09-24 NOTE — Assessment & Plan Note (Signed)
Chronic and uncontrolled, but improving Started Cymbalta 2 weeks ago and seeing some improvement Continue Cymbalta 30 mg daily and Wellbutrin 150 mg daily Passive SI has improved as below Extend FMLA leave for 2 more weeks Contracted for safety Continue to work with therapist Follow-up in 2 weeks

## 2020-09-27 ENCOUNTER — Encounter: Payer: Self-pay | Admitting: Family Medicine

## 2020-09-27 NOTE — Telephone Encounter (Signed)
Letter completed and printed. Please fax to [place] (see previous FMLA papers). May need to have another provider sign it if required.  Please let patient know when it has been faxed. Thanks!

## 2020-10-09 ENCOUNTER — Telehealth (INDEPENDENT_AMBULATORY_CARE_PROVIDER_SITE_OTHER): Payer: 59 | Admitting: Physician Assistant

## 2020-10-09 ENCOUNTER — Encounter: Payer: Self-pay | Admitting: Physician Assistant

## 2020-10-09 DIAGNOSIS — F331 Major depressive disorder, recurrent, moderate: Secondary | ICD-10-CM | POA: Diagnosis not present

## 2020-10-09 DIAGNOSIS — R69 Illness, unspecified: Secondary | ICD-10-CM | POA: Diagnosis not present

## 2020-10-09 MED ORDER — BUPROPION HCL ER (XL) 150 MG PO TB24: 150.0000 mg | ORAL_TABLET | Freq: Every day | ORAL | 2 refills | Status: DC

## 2020-10-09 NOTE — Progress Notes (Signed)
MyChart Video Visit    Virtual Visit via Video Note   This visit type was conducted due to national recommendations for restrictions regarding the COVID-19 Pandemic (e.g. social distancing) in an effort to limit this patient's exposure and mitigate transmission in our community. This patient is at least at moderate risk for complications without adequate follow up. This format is felt to be most appropriate for this patient at this time. Physical exam was limited by quality of the video and audio technology used for the visit.   Patient location: Home Provider location: Office   I discussed the limitations of evaluation and management by telemedicine and the availability of in person appointments. The patient expressed understanding and agreed to proceed.  Patient: Ebony Steele   DOB: 1976/01/07   45 y.o. Female  MRN: [id number] Visit Date: 10/09/2020  Today's healthcare provider: Trinna Post, PA-C   Chief Complaint  Patient presents with  . Depression  . Anxiety   Subjective    HPI  Depression, Follow-up  She  was last seen for this 2 weeks ago. Changes made at last visit include continue Cymbalta 30 mg and Wellbutrin 150 mg daily. Her FMLA was extended until 10/10/2020 at last visit.    She reports excellent compliance with treatment. She is not having side effects.  She reports excellent tolerance of treatment. Current symptoms include: depressed mood and difficulty concentrating She feels she is Improved since last visit. She reports she is ready to go back to work. She is not having any thoughts of harming herself. She is currently a hospice case Freight forwarder.   Depression screen [facility] 2/9 10/09/2020 09/24/2020 09/10/2020  Decreased Interest 1 2 3   Down, Depressed, Hopeless 1 2 3   PHQ - 2 Score 2 4 6   Altered sleeping 2 1 3   Tired, decreased energy 1 1 3   Change in appetite 2 1 2   Feeling bad or failure about yourself  0 0 3  Trouble concentrating 1 2 3   Moving  slowly or fidgety/restless 0 0 2  Suicidal thoughts 0 0 2  PHQ-9 Score 8 9 24   Difficult doing work/chores Somewhat difficult Very difficult Extremely dIfficult  Some recent data might be hidden    ----------------------------------------------------------------------------------------- Anxiety, Follow-up  She was last seen for anxiety 2 weeks ago. Changes made at last visit include no changes.   She reports excellent compliance with treatment. She reports excellent tolerance of treatment. She is not having side effects.   She feels her anxiety is mild and Improved since last visit.  Symptoms: No chest pain No difficulty concentrating  No dizziness Yes fatigue  No feelings of losing control No insomnia  Yes irritable No palpitations  No panic attacks No racing thoughts  No shortness of breath No sweating  No tremors/shakes    GAD-7 Results GAD-7 Generalized Anxiety Disorder Screening Tool 10/09/2020 09/24/2020 02/24/2020  1. Feeling Nervous, Anxious, or on Edge 1 1 3   2. Not Being Able to Stop or Control Worrying 1 1 3   3. Worrying Too Much About Different Things 1 2 3   4. Trouble Relaxing 1 2 3   5. Being So Restless it's Hard To Sit Still 1 0 3  6. Becoming Easily Annoyed or Irritable 2 2 3   7. Feeling Afraid As If Something Awful Might Happen 0 0 0  Total GAD-7 Score 7 8 18   Difficulty At Work, Home, or Getting  Along With Others? Somewhat difficult Very difficult Extremely difficult  PHQ-9 Scores PHQ9 SCORE ONLY 10/09/2020 09/24/2020 09/10/2020  PHQ-9 Total Score 8 9 24     ---------------------------------------------------------------------------------------------------   Patient Active Problem List   Diagnosis Date Noted  . Passive suicidal ideations 09/10/2020  . Elevated BP without diagnosis of hypertension 09/10/2020  . Axillary lymphadenopathy 09/10/2020  . Need for hepatitis C screening test 09/10/2020  . GAD (generalized anxiety disorder) 01/13/2020  .  Concentration deficit 01/13/2020  . Abnormal chest CT 09/18/2015  . Cyst of ovary 09/17/2015  . Allergic rhinitis 03/09/2015  . Absolute anemia 03/09/2015  . Headache disorder 03/09/2015  . CN (constipation) 03/09/2015  . Clinical depression 03/09/2015  . Esophageal reflux 03/09/2015  . Insomnia 03/09/2015  . Hypothyroidism 04/19/2007  . Abnormal blood sugar 04/13/2007  . Big thyroid 04/13/2007  . Current tobacco use 04/13/2007   Social History   Tobacco Use  . Smoking status: Current Every Day Smoker    Types: Cigars  . Smokeless tobacco: Never Used  . Tobacco comment: Smoked 1 cigar per day  Vaping Use  . Vaping Use: Never used  Substance Use Topics  . Alcohol use: Yes    Comment: Occasional Wine  . Drug use: No    Types: Marijuana    Comment: Pt reports using Marijuana to help with depression symptoms.  She stopped  the end of May 2018   Allergies  Allergen Reactions  . Acetaminophen       Medications: Outpatient Medications Prior to Visit  Medication Sig  . DULoxetine (CYMBALTA) 30 MG capsule Take 1 capsule (30 mg total) by mouth daily.  Marland Kitchen levothyroxine (SYNTHROID) 88 MCG tablet Take 1 tablet (88 mcg total) by mouth daily.  Marland Kitchen omeprazole (PRILOSEC) 40 MG capsule Take 1 capsule (40 mg total) by mouth daily.  . traZODone (DESYREL) 50 MG tablet Take 1-2 tablets (50-100 mg total) by mouth at bedtime as needed for sleep.  . [DISCONTINUED] buPROPion (WELLBUTRIN XL) 150 MG 24 hr tablet Take 1 tablet (150 mg total) by mouth daily.   No facility-administered medications prior to visit.    Review of Systems  Constitutional: Positive for activity change, appetite change and fatigue.  Respiratory: Negative for chest tightness and shortness of breath.   Cardiovascular: Negative for chest pain and palpitations.  Psychiatric/Behavioral: Positive for decreased concentration and sleep disturbance. Negative for agitation, self-injury and suicidal ideas. The patient is  nervous/anxious.     Last CBC Lab Results  Component Value Date   WBC 4.7 09/10/2020   HGB 13.8 09/10/2020   HCT 39.9 09/10/2020   MCV 84 09/10/2020   MCH 29.1 09/10/2020   RDW 11.8 09/10/2020   PLT 193 09/10/2020      Objective    There were no vitals taken for this visit. BP Readings from Last 3 Encounters:  09/10/20 (!) 144/90  05/15/20 113/78  01/13/20 131/86   Wt Readings from Last 3 Encounters:  09/10/20 138 lb 3.2 oz (62.7 kg)  05/15/20 145 lb (65.8 kg)  01/13/20 141 lb 3.2 oz (64 kg)      Physical Exam Constitutional:      Appearance: Normal appearance.  Pulmonary:     Effort: Pulmonary effort is normal. No respiratory distress.  Neurological:     Mental Status: She is alert.  Psychiatric:        Mood and Affect: Mood normal.        Behavior: Behavior normal.        Assessment & Plan    1. Moderate episode of  recurrent major depressive disorder ([place])  Have provided work note to return to work through [employer]. F/u w/ PCP 6 months for anxiety and depression.  - buPROPion (WELLBUTRIN XL) 150 MG 24 hr tablet; Take 1 tablet (150 mg total) by mouth daily.  Dispense: 30 tablet; Refill: 2   Return in about 6 months (around 04/08/2021) for anxiety and depression .     I discussed the assessment and treatment plan with the patient. The patient was provided an opportunity to ask questions and all were answered. The patient agreed with the plan and demonstrated an understanding of the instructions.   The patient was advised to call back or seek an in-person evaluation if the symptoms worsen or if the condition fails to improve as anticipated.   ITrinna Post, PA-C, have reviewed all documentation for this visit. The documentation on 10/09/20 for the exam, diagnosis, procedures, and orders are all accurate and complete.  The entirety of the information documented in the History of Present Illness, Review of Systems and Physical Exam were personally  obtained by me. Portions of this information were initially documented by Lynford Humphrey, CMA and reviewed by me for thoroughness and accuracy.    Paulene Floor [facility] [phone] (phone) [phone] (fax)  [place]

## 2020-11-20 ENCOUNTER — Telehealth: Payer: Self-pay

## 2020-11-20 NOTE — Telephone Encounter (Signed)
Copied from [place] [id number]. Topic: General - Other >> Nov 20, 2020  1:49 PM Tessa Lerner A wrote: Reason for CRM: Patient would like to be contacted regarding urinary discomfort   Patient has been feeling discomfort for roughly three days since 11/16/20  Patient would like to be seen by PCP  Patient declined the earliest available appt with PCP which was 8 AM 11/29/20 at the time of call with agent  Please contact to further advise

## 2020-11-22 ENCOUNTER — Other Ambulatory Visit: Payer: Self-pay

## 2020-11-22 ENCOUNTER — Telehealth: Payer: Self-pay | Admitting: Family Medicine

## 2020-11-22 ENCOUNTER — Ambulatory Visit
Admission: EM | Admit: 2020-11-22 | Discharge: 2020-11-22 | Disposition: A | Discharge status: Home / Self Care | Payer: 59 | Attending: Family Medicine | Admitting: Family Medicine

## 2020-11-22 DIAGNOSIS — R350 Frequency of micturition: Secondary | ICD-10-CM

## 2020-11-22 DIAGNOSIS — R590 Localized enlarged lymph nodes: Secondary | ICD-10-CM

## 2020-11-22 DIAGNOSIS — N3289 Other specified disorders of bladder: Secondary | ICD-10-CM

## 2020-11-22 LAB — POCT URINALYSIS DIP (MANUAL ENTRY)
Bilirubin, UA: NEGATIVE
Blood, UA: NEGATIVE
Glucose, UA: NEGATIVE mg/dL
Ketones, POC UA: NEGATIVE mg/dL
Leukocytes, UA: NEGATIVE
Nitrite, UA: NEGATIVE
Protein Ur, POC: NEGATIVE mg/dL
Spec Grav, UA: <=1.005 — AB (ref 1.010–1.025)
Urobilinogen, UA: 0.2 E.U./dL
pH, UA: 5 (ref 5.0–8.0)

## 2020-11-22 NOTE — Telephone Encounter (Incomplete Revision)
Per [facility] Pol[facility] pt will need diagnostic bilateral  Mammogram TOMO if pt is still having  issue [id number] and right breast limited ultrasound [id number]. Pt is just now returning my call,Thanks  Can you also add limited right & left breast ultrasounds per Melissa at [facility]

## 2020-11-22 NOTE — ED Provider Notes (Signed)
[name]    CSN: [id number] Arrival date & time: 11/22/20  2836      History   Chief Complaint Chief Complaint  Patient presents with  . Urinary Frequency    HPI Ebony Steele is a 45 y.o. female.   Patient is a 45 year old female presents today with urinary frequency, bladder spasming.  This is been for the past 3 days.  Did have some hematuria.  No dysuria, fever, flank pain, nausea vomiting or chills.  Recent increase in sexual activity with new partner from new marriage.  No vaginal discharge, itching or irritation.  No concern for STDs.  Recently came off her menstrual cycle.  No birth control currently.  Has increased her water intake and decrease caffeine intake.  Denies any concern for constipation.   Urinary Frequency    Past Medical History:  Diagnosis Date  . Asthma   . BV (bacterial vaginosis)   . Hypertension   . Menometrorrhagia   . Thyroid disease     Patient Active Problem List   Diagnosis Date Noted  . Passive suicidal ideations 09/10/2020  . Elevated BP without diagnosis of hypertension 09/10/2020  . Axillary lymphadenopathy 09/10/2020  . Need for hepatitis C screening test 09/10/2020  . GAD (generalized anxiety disorder) 01/13/2020  . Concentration deficit 01/13/2020  . Abnormal chest CT 09/18/2015  . Cyst of ovary 09/17/2015  . Allergic rhinitis 03/09/2015  . Absolute anemia 03/09/2015  . Headache disorder 03/09/2015  . CN (constipation) 03/09/2015  . Clinical depression 03/09/2015  . Esophageal reflux 03/09/2015  . Insomnia 03/09/2015  . Hypothyroidism 04/19/2007  . Abnormal blood sugar 04/13/2007  . Big thyroid 04/13/2007  . Current tobacco use 04/13/2007    Past Surgical History:  Procedure Laterality Date  . DILATION AND CURETTAGE OF UTERUS  2005  . LAPAROSCOPY  2000  . OOPHORECTOMY Right 2000   Tertoma  . TUBAL LIGATION      OB History    Gravida  5   Para  3   Term  3   Preterm      AB  2   Living   3     SAB      IAB  1   Ectopic  1   Multiple      Live Births  3            Home Medications    Prior to Admission medications   Medication Sig Start Date End Date Taking? Authorizing Provider  buPROPion (WELLBUTRIN XL) 150 MG 24 hr tablet Take 1 tablet (150 mg total) by mouth daily. 10/09/20   Trinna Post, PA-C  DULoxetine (CYMBALTA) 30 MG capsule Take 1 capsule (30 mg total) by mouth daily. 09/10/20   Virginia Crews, MD  levothyroxine (SYNTHROID) 88 MCG tablet Take 1 tablet (88 mcg total) by mouth daily. 01/13/20   Virginia Crews, MD  omeprazole (PRILOSEC) 40 MG capsule Take 1 capsule (40 mg total) by mouth daily. 05/15/20   Virginia Crews, MD  traZODone (DESYREL) 50 MG tablet Take 1-2 tablets (50-100 mg total) by mouth at bedtime as needed for sleep. 01/13/20   Virginia Crews, MD    Family History Family History  Problem Relation Age of Onset  . Diabetes Mother   . Depression Mother   . Hypertension Mother   . Glaucoma Mother   . Kidney disease Father   . Diabetes Father   . Diabetes Mellitus I Brother  Social History Social History   Tobacco Use  . Smoking status: Current Every Day Smoker    Types: Cigars  . Smokeless tobacco: Never Used  . Tobacco comment: Smoked 1 cigar per day  Vaping Use  . Vaping Use: Never used  Substance Use Topics  . Alcohol use: Yes    Comment: Occasional Wine  . Drug use: No    Types: Marijuana    Comment: Pt reports using Marijuana to help with depression symptoms.  She stopped  the end of May 2018     Allergies   Acetaminophen   Review of Systems Review of Systems  Genitourinary: Positive for frequency.     Physical Exam Triage Vital Signs ED Triage Vitals [11/22/20 0842]  Enc Vitals Group     BP (!) 151/105     Pulse Rate 98     Resp (!) 98     Temp 98.8 F (37.1 C)     Temp Source Oral     SpO2 98 %     Weight 138 lb (62.6 kg)     Height 5\' 4"  (1.626 m)     Head  Circumference      Peak Flow      Pain Score 5     Pain Loc      Pain Edu?      Excl. in [place]?    No data found.  Updated Vital Signs BP (!) 151/105   Pulse 98   Temp 98.8 F (37.1 C) (Oral)   Resp (!) 98   Ht 5\' 4"  (1.626 m)   Wt 138 lb (62.6 kg)   SpO2 98%   BMI 23.69 kg/m   Visual Acuity Right Eye Distance:   Left Eye Distance:   Bilateral Distance:    Right Eye Near:   Left Eye Near:    Bilateral Near:     Physical Exam Vitals and nursing note reviewed.  Constitutional:      General: She is not in acute distress.    Appearance: Normal appearance. She is not ill-appearing, toxic-appearing or diaphoretic.  HENT:     Head: Normocephalic.  Eyes:     Conjunctiva/sclera: Conjunctivae normal.  Pulmonary:     Effort: Pulmonary effort is normal.  Musculoskeletal:        General: Normal range of motion.     Cervical back: Normal range of motion.  Skin:    General: Skin is warm and dry.     Findings: No rash.  Neurological:     Mental Status: She is alert.  Psychiatric:        Mood and Affect: Mood normal.      UC Treatments / Results  Labs (all labs ordered are listed, but only abnormal results are displayed) Labs Reviewed  POCT URINALYSIS DIP (MANUAL ENTRY) - Abnormal; Notable for the following components:      Result Value   Spec Grav, UA <=1.005 (*)    All other components within normal limits  URINE CULTURE    EKG   Radiology No results found.  Procedures Procedures (including critical care time)  Medications Ordered in UC Medications - No data to display  Initial Impression / Assessment and Plan / UC Course  I have reviewed the triage vital signs and the nursing notes.  Pertinent labs & imaging results that were available during my care of the patient were reviewed by me and considered in my medical decision making (see chart for details).  Bladder spasm Non concern for UTI here today. Urine normal. Sending for culture.   Recommended continue to push fluids, decrease caffeine.  Recommended increase fiber in case this is constipation related.  Decreased sexual activity for now. Follow up as needed for continued or worsening symptoms  Final Clinical Impressions(s) / UC Diagnoses   Final diagnoses:  Bladder spasm     Discharge Instructions     Our urine was normal I will send for culture Continue to drink water, decrease caffeine.  In case this is constipation related increase fiber and water.  Follow up as needed for continued or worsening symptoms     ED Prescriptions    None     PDMP not reviewed this encounter.   Orvan July, NP 11/22/20 [phone]

## 2020-11-22 NOTE — Telephone Encounter (Signed)
Per [facility] Pol[facility] pt will need diagnostic bilateral  Mammogram TOMO if pt is still having  issue [id number] and right breast limited ultrasound [id number]. Pt is just now returning my call,Thanks  Can you also add limited right & left breast ultrasounds per Melissa at [facility]

## 2020-11-22 NOTE — Discharge Instructions (Addendum)
Our urine was normal I will send for culture Continue to drink water, decrease caffeine.  In case this is constipation related increase fiber and water.  Follow up as needed for continued or worsening symptoms

## 2020-11-22 NOTE — ED Triage Notes (Signed)
Pt reports having urinary frequency and spasms x3 days. Also reports having a few episodes of hematuria 2 days ago.

## 2020-11-23 LAB — URINE CULTURE: Culture: NO GROWTH

## 2020-11-23 NOTE — Telephone Encounter (Signed)
FYI Patient went to urgent care yesterday, and they did send her urine for culture. Patient is waiting on culture result.

## 2020-11-27 NOTE — Telephone Encounter (Signed)
[place]. If she still needs to be seen, I could work her in. Sounds like she was taken care of at [facility] though.

## 2020-11-28 ENCOUNTER — Other Ambulatory Visit: Payer: Self-pay

## 2020-11-28 DIAGNOSIS — Z1231 Encounter for screening mammogram for malignant neoplasm of breast: Secondary | ICD-10-CM

## 2020-11-28 DIAGNOSIS — R590 Localized enlarged lymph nodes: Secondary | ICD-10-CM

## 2020-12-04 ENCOUNTER — Other Ambulatory Visit: Payer: Self-pay

## 2020-12-04 DIAGNOSIS — R590 Localized enlarged lymph nodes: Secondary | ICD-10-CM

## 2020-12-04 DIAGNOSIS — Z1231 Encounter for screening mammogram for malignant neoplasm of breast: Secondary | ICD-10-CM

## 2020-12-04 NOTE — Progress Notes (Signed)
Ok to order as requested 

## 2020-12-04 NOTE — Progress Notes (Signed)
Per Parke Poisson, [facility] is requesting diagnostic bilateral mammogram TOMO [phone] and [phone].  Per Lenna Sciara at [place] also add limited right and left breast ultrasound.

## 2020-12-05 ENCOUNTER — Telehealth: Payer: Self-pay | Admitting: Family Medicine

## 2020-12-05 DIAGNOSIS — R590 Localized enlarged lymph nodes: Secondary | ICD-10-CM

## 2020-12-05 NOTE — Telephone Encounter (Signed)
Ebony Steele from [facility] states they will need a diagnostic bilateral mammogram as it is also time for pt's yearly,Thanks

## 2020-12-10 ENCOUNTER — Encounter: Payer: Self-pay | Admitting: Physician Assistant

## 2020-12-10 ENCOUNTER — Other Ambulatory Visit: Payer: Self-pay

## 2020-12-10 ENCOUNTER — Ambulatory Visit
Admission: RE | Admit: 2020-12-10 | Discharge: 2020-12-10 | Disposition: A | Discharge status: Home / Self Care | Payer: Managed Care, Other (non HMO) | Source: Ambulatory Visit | Attending: Family Medicine | Admitting: Family Medicine

## 2020-12-10 ENCOUNTER — Ambulatory Visit: Admission: RE | Admit: 2020-12-10 | Payer: Managed Care, Other (non HMO) | Source: Ambulatory Visit

## 2020-12-10 DIAGNOSIS — R590 Localized enlarged lymph nodes: Secondary | ICD-10-CM | POA: Insufficient documentation

## 2020-12-11 NOTE — Telephone Encounter (Signed)
Mammogram results- follow advice of breast center as below and schedule follow up with office as directed / needed at anytime.  IMPRESSION: 1. Palpable abnormality in the RIGHT axilla is a resolving benign intradermal inclusion cyst. 2. Indeterminate calcifications in the RIGHT breast warranting tissue diagnosis.  RECOMMENDATION: Stereotactic guided core biopsy of RIGHT breast calcifications.  I have discussed the findings and recommendations with the patient. If applicable, a reminder letter will be sent to the patient regarding the next appointment.  BI-RADS CATEGORY  4: Suspicious.   Electronically Signed   By: Nolon Nations M.D.   On: 12/10/2020 16:28

## 2020-12-12 ENCOUNTER — Other Ambulatory Visit: Payer: Self-pay | Admitting: Physician Assistant

## 2020-12-12 DIAGNOSIS — R928 Other abnormal and inconclusive findings on diagnostic imaging of breast: Secondary | ICD-10-CM

## 2020-12-12 DIAGNOSIS — R921 Mammographic calcification found on diagnostic imaging of breast: Secondary | ICD-10-CM

## 2020-12-19 ENCOUNTER — Ambulatory Visit
Admission: RE | Admit: 2020-12-19 | Discharge: 2020-12-19 | Disposition: A | Discharge status: Home / Self Care | Payer: Managed Care, Other (non HMO) | Source: Ambulatory Visit | Attending: Adult Health | Admitting: Adult Health

## 2020-12-19 ENCOUNTER — Other Ambulatory Visit: Payer: Self-pay

## 2020-12-19 DIAGNOSIS — R928 Other abnormal and inconclusive findings on diagnostic imaging of breast: Secondary | ICD-10-CM | POA: Diagnosis not present

## 2020-12-19 DIAGNOSIS — R921 Mammographic calcification found on diagnostic imaging of breast: Secondary | ICD-10-CM | POA: Diagnosis present

## 2020-12-19 HISTORY — PX: BREAST BIOPSY: SHX20

## 2020-12-19 NOTE — Progress Notes (Signed)
  IMPRESSION: Appropriate positioning of the X shaped biopsy marking clip at the site of biopsy in the RIGHT upper breast.  Final Assessment: Post Procedure Mammograms for Marker Placement   Electronically Signed   By: Valentino Saxon MD   On: 12/19/2020 13:45

## 2020-12-19 NOTE — Progress Notes (Signed)
IMPRESSION: Stereotactic-guided biopsy of indeterminate calcifications. No apparent complications.   Electronically Signed   By: Valentino Saxon MD   On: 12/19/2020 13:44

## 2020-12-20 ENCOUNTER — Encounter: Payer: Self-pay | Admitting: *Deleted

## 2020-12-20 DIAGNOSIS — D0511 Intraductal carcinoma in situ of right breast: Secondary | ICD-10-CM

## 2020-12-20 NOTE — Progress Notes (Signed)
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  Called and spoke to patient.  She is a Merchandiser, retail in [place].  States she would like to see Dr. Peyton Najjar and Dr. Janese Banks.  I have scheduled her an appointment for 12/24/20 at 8:00 with Dr. Peyton Najjar and 12/25/20 @ 1:30 with Dr. Janese Banks.  Will give educational material to patient at her medical oncology consult.  She was encouraged to call with any questions or needs.

## 2020-12-24 LAB — SURGICAL PATHOLOGY

## 2020-12-25 ENCOUNTER — Inpatient Hospital Stay: Payer: Managed Care, Other (non HMO)

## 2020-12-25 ENCOUNTER — Encounter: Payer: Self-pay | Admitting: *Deleted

## 2020-12-25 ENCOUNTER — Encounter: Payer: Self-pay | Admitting: Oncology

## 2020-12-25 ENCOUNTER — Inpatient Hospital Stay: Payer: Managed Care, Other (non HMO) | Attending: Oncology | Admitting: Oncology

## 2020-12-25 VITALS — BP 113/90 | HR 80 | Temp 98.1°F | Resp 20 | Wt 138.1 lb

## 2020-12-25 DIAGNOSIS — Z170 Estrogen receptor positive status [ER+]: Secondary | ICD-10-CM | POA: Diagnosis not present

## 2020-12-25 DIAGNOSIS — D0511 Intraductal carcinoma in situ of right breast: Secondary | ICD-10-CM | POA: Diagnosis present

## 2020-12-25 DIAGNOSIS — E039 Hypothyroidism, unspecified: Secondary | ICD-10-CM | POA: Diagnosis not present

## 2020-12-25 DIAGNOSIS — F17210 Nicotine dependence, cigarettes, uncomplicated: Secondary | ICD-10-CM | POA: Insufficient documentation

## 2020-12-25 DIAGNOSIS — I10 Essential (primary) hypertension: Secondary | ICD-10-CM | POA: Diagnosis not present

## 2020-12-25 NOTE — Progress Notes (Signed)
Met patient and her husband today during her initial medical oncology consult with Dr. Janese Banks.  Gave patient breast cancer educational literature, "My Breast Cancer Treatment Handbook" by Josephine Igo, RN.  Plan for surgery, followed by radiation and antihormonal therapy.  Patient encouraged to call with any questions or needs.

## 2020-12-25 NOTE — Progress Notes (Signed)
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 Brita Romp  Date of visit: 12/25/20   History of presenting illness-patient is a 45 year old premenopausal African-American female who was noted to have a palpable abnormality in her right axilla for which she underwent a bilateral diagnostic mammogram.  This was her first mammogram with no prior abnormal mammograms or breast biopsies.  Mammogram showed abnormal calcifications 0.3 x 1.6 x 0.4 cm in the upper inner quadrant of the right breast.  Intradermal hypoechoic collection in the right axilla measuring 0.8 x 0.4 x 0.7 cm consistent with a benign intradermal inclusion cyst.  Her abnormal calcifications were biopsied and was consistent with low-grade DCIS ER positive.She has met with Dr. Peyton Najjar and plans to undergo lumpectomy in the near future.  No prior use of birth control.  No family history of breast, ovarian, gastric: Or pancreatic cancer.  No family history of melanomas  ECOG PS- 0  Pain scale- 0   Review of systems- Review of Systems  Constitutional: Negative for chills, fever, malaise/fatigue and weight loss.  HENT: Negative for congestion, ear discharge and nosebleeds.   Eyes: Negative for blurred vision.  Respiratory: Negative for cough, hemoptysis, sputum production, shortness of breath and wheezing.   Cardiovascular: Negative for chest pain, palpitations, orthopnea and claudication.  Gastrointestinal: Negative for abdominal pain, blood in stool, constipation, diarrhea, heartburn, melena, nausea and vomiting.  Genitourinary: Negative for dysuria, flank pain, frequency, hematuria and urgency.  Musculoskeletal: Negative for back pain, joint  pain and myalgias.  Skin: Negative for rash.  Neurological: Negative for dizziness, tingling, focal weakness, seizures, weakness and headaches.  Endo/Heme/Allergies: Does not bruise/bleed easily.  Psychiatric/Behavioral: Negative for depression and suicidal ideas. The patient is nervous/anxious. The patient does not have insomnia.     Allergies  Allergen Reactions  . Acetaminophen     Patient Active Problem List   Diagnosis Date Noted  . Passive suicidal ideations 09/10/2020  . Elevated BP without diagnosis of hypertension 09/10/2020  . Axillary lymphadenopathy 09/10/2020  . Need for hepatitis C screening test 09/10/2020  . GAD (generalized anxiety disorder) 01/13/2020  . Concentration deficit 01/13/2020  . Abnormal chest CT 09/18/2015  . Cyst of ovary 09/17/2015  . Allergic rhinitis 03/09/2015  . Absolute anemia 03/09/2015  . Headache disorder 03/09/2015  . CN (constipation) 03/09/2015  . Clinical depression 03/09/2015  . Esophageal reflux 03/09/2015  . Insomnia 03/09/2015  . Hypothyroidism 04/19/2007  . Abnormal blood sugar 04/13/2007  . Big thyroid 04/13/2007  . Current tobacco use 04/13/2007     Past Medical History:  Diagnosis Date  . Asthma   . BV (bacterial vaginosis)   . Hypertension   . Menometrorrhagia   . Thyroid disease      Past Surgical History:  Procedure Laterality Date  . BREAST BIOPSY Right 12/19/2020   stereo bx/ x clip/ path pending  . DILATION AND CURETTAGE OF UTERUS  2005  . LAPAROSCOPY  2000  . OOPHORECTOMY Right 2000   Tertoma  . TUBAL LIGATION      Social History   Socioeconomic History  . Marital status: Married    Spouse name: Not on file  . Number of children: 3  . Years of education: College  . Highest education level:  Not on file  Occupational History  . 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 Smoking status: Current Every Day Smoker    Types: Cigars  . Smokeless tobacco: Never  Used  . Tobacco comment: Smoked 1 cigar per day  Vaping Use  . Vaping Use: Never used  Substance and Sexual Activity  . Alcohol use: Yes    Comment: Occasional Wine  . Drug use: No    Types: Marijuana    Comment: Pt reports using Marijuana to help with depression symptoms.  She stopped  the end of May 2018  . Sexual activity: Yes    Birth control/protection: I.U.D.    Comment: Mirena  Other Topics Concern  . Not on file  Social History Narrative  . Not on file   Social Determinants of Health   Financial Resource Strain: Not on file  Food Insecurity: Not on file  Transportation Needs: Not on file  Physical Activity: Not on file  Stress: Not on file  Social Connections: Not on file  Intimate Partner Violence: Not on file     Family History  Problem Relation Age of Onset  . Diabetes Mother   . Depression Mother   . Hypertension Mother   . Glaucoma Mother   . Kidney disease Father   . Diabetes Father   . Diabetes Mellitus I Brother      Current Outpatient Medications:  .  buPROPion (WELLBUTRIN XL) 150 MG 24 hr tablet, Take 1 tablet (150 mg total) by mouth daily., Disp: 30 tablet, Rfl: 2 .  levothyroxine (SYNTHROID) 88 MCG tablet, Take 1 tablet (88 mcg total) by mouth daily., Disp: 90 tablet, Rfl: 3 .  omeprazole (PRILOSEC) 40 MG capsule, Take 1 capsule (40 mg total) by mouth daily., Disp: 30 capsule, Rfl: 3 .  traZODone (DESYREL) 50 MG tablet, Take 1-2 tablets (50-100 mg total) by mouth at bedtime as needed for sleep., Disp: 60 tablet, Rfl: 3   Physical exam:  Vitals:   12/25/20 1339  BP: 113/90  Pulse: 80  Resp: 20  Temp: 98.1 F (36.7 C)  TempSrc: Tympanic  SpO2: 100%  Weight: 138 lb 1.6 oz (62.6 kg)   Physical Exam Constitutional:      General: She is not in acute distress. Cardiovascular:     Rate and Rhythm: Normal rate and regular rhythm.     Heart sounds: Normal heart sounds.  Pulmonary:     Effort: Pulmonary effort is normal.  Abdominal:      General: Bowel sounds are normal.     Palpations: Abdomen is soft.  Skin:    General: Skin is warm and dry.  Neurological:     Mental Status: She is alert and oriented to person, place, and time.        CMP Latest Ref Rng & Units 09/10/2020  Glucose 65 - 99 mg/dL 84  BUN 6 - 24 mg/dL 10  Creatinine 0.57 - 1.00 mg/dL 0.88  Sodium 134 - 144 mmol/L 139  Potassium 3.5 - 5.2 mmol/L 4.1  Chloride 96 - 106 mmol/L 107(H)  CO2 20 - 29 mmol/L 20  Calcium 8.7 - 10.2 mg/dL 9.2  Total Protein 6.0 - 8.5 g/dL 7.3  Total Bilirubin 0.0 - 1.2 mg/dL 0.3  Alkaline Phos 44 - 121 IU/L 75  AST 0 - 40 IU/L 16  ALT 0 - 32 IU/L 14   CBC Latest Ref Rng & Units 09/10/2020  WBC 3.4 -  10.8 x10E3/uL 4.7  Hemoglobin 11.1 - 15.9 g/dL 13.8  Hematocrit 34.0 - 46.6 % 39.9  Platelets 150 - 450 x10E3/uL 193    No images are attached to the encounter.  US BREAST LTD UNI RIGHT INC AXILLA  Result Date: 12/10/2020 CLINICAL DATA:  Palpable abnormality in the RIGHT axilla first noted several months ago. The mass continues to get smaller. She denies any drainage from the lesion. Baseline exam. EXAM: DIGITAL DIAGNOSTIC BILATERAL MAMMOGRAM WITH TOMOSYNTHESIS AND CAD; ULTRASOUND RIGHT BREAST LIMITED TECHNIQUE: Bilateral digital diagnostic mammography and breast tomosynthesis was performed. The images were evaluated with computer-aided detection.; Targeted ultrasound examination of the right breast was performed COMPARISON:  None. ACR Breast Density Category b: There are scattered areas of fibroglandular density. FINDINGS: Spot tangential view of the RIGHT axillary region shows superficial focal density further evaluated with ultrasound. Magnified views are performed of calcifications in the UPPER INNER QUADRANT of the RIGHT breast. Magnified views show calcifications bearing in size, shape, and density. Calcifications do not layer on true LATERAL projections. Calcifications span 0.3 x 1.6 x 0.4 centimeters. On physical exam,  there is a small papule high in the RIGHT axilla, not associated erythema or significant tenderness. Targeted ultrasound is performed, showing an intradermal hypoechoic collection corresponding to the palpable abnormality in the RIGHT axilla. This collection measures 0.8 x 0.4 x 0.7 centimeters. Findings are consistent with benign intradermal inclusion cyst. IMPRESSION: 1. Palpable abnormality in the RIGHT axilla is a resolving benign intradermal inclusion cyst. 2. Indeterminate calcifications in the RIGHT breast warranting tissue diagnosis. RECOMMENDATION: Stereotactic guided core biopsy of RIGHT breast calcifications. I have discussed the findings and recommendations with the patient. If applicable, a reminder letter will be sent to the patient regarding the next appointment. BI-RADS CATEGORY  4: Suspicious. Electronically Signed   By: Nolon Nations M.D.   On: 12/10/2020 16:28   MM DIAG BREAST TOMO BILATERAL  Result Date: 12/10/2020 CLINICAL DATA:  Palpable abnormality in the RIGHT axilla first noted several months ago. The mass continues to get smaller. She denies any drainage from the lesion. Baseline exam. EXAM: DIGITAL DIAGNOSTIC BILATERAL MAMMOGRAM WITH TOMOSYNTHESIS AND CAD; ULTRASOUND RIGHT BREAST LIMITED TECHNIQUE: Bilateral digital diagnostic mammography and breast tomosynthesis was performed. The images were evaluated with computer-aided detection.; Targeted ultrasound examination of the right breast was performed COMPARISON:  None. ACR Breast Density Category b: There are scattered areas of fibroglandular density. FINDINGS: Spot tangential view of the RIGHT axillary region shows superficial focal density further evaluated with ultrasound. Magnified views are performed of calcifications in the UPPER INNER QUADRANT of the RIGHT breast. Magnified views show calcifications bearing in size, shape, and density. Calcifications do not layer on true LATERAL projections. Calcifications span 0.3 x 1.6 x 0.4  centimeters. On physical exam, there is a small papule high in the RIGHT axilla, not associated erythema or significant tenderness. Targeted ultrasound is performed, showing an intradermal hypoechoic collection corresponding to the palpable abnormality in the RIGHT axilla. This collection measures 0.8 x 0.4 x 0.7 centimeters. Findings are consistent with benign intradermal inclusion cyst. IMPRESSION: 1. Palpable abnormality in the RIGHT axilla is a resolving benign intradermal inclusion cyst. 2. Indeterminate calcifications in the RIGHT breast warranting tissue diagnosis. RECOMMENDATION: Stereotactic guided core biopsy of RIGHT breast calcifications. I have discussed the findings and recommendations with the patient. If applicable, a reminder letter will be sent to the patient regarding the next appointment. BI-RADS CATEGORY  4: Suspicious. Electronically Signed   By: Nolon Nations M.D.  On: 12/10/2020 16:28   MM CLIP PLACEMENT RIGHT  Result Date: 12/19/2020 CLINICAL DATA:  Status post stereotactic guided biopsy EXAM: DIAGNOSTIC RIGHT MAMMOGRAM POST STEREOTACTIC BIOPSY COMPARISON:  Previous exam(s). FINDINGS: Mammographic images were obtained following stereotactic guided biopsy of indeterminate calcifications. The X biopsy marking clip is in expected position at the site of biopsy. No grouped residual calcifications are noted on post biopsy images. IMPRESSION: Appropriate positioning of the X shaped biopsy marking clip at the site of biopsy in the RIGHT upper breast. Final Assessment: Post Procedure Mammograms for Marker Placement Electronically Signed   By: Valentino Saxon MD   On: 12/19/2020 13:45   MM RT BREAST BX W LOC DEV 1ST LESION IMAGE BX SPEC STEREO GUIDE  Addendum Date: 12/20/2020   ADDENDUM REPORT: 12/20/2020 13:31 ADDENDUM: PATHOLOGY revealed: A. RIGHT BREAST, UPPER INNER QUADRANT CALCIFICATIONS; STEREOTACTIC BIOPSY: - DUCTAL CARCINOMA IN SITU, LOW GRADE. - FIBROADENOMATOID CHANGES WITH  ASSOCIATED CALCIFICATIONS. Comment: DCIS involves 4 of 7 tissue blocks. ER will be reported in an addendum. Pathology results are CONCORDANT with imaging findings, per Dr. Valentino Saxon. Pathology results and recommendations below were discussed with patient by telephone on 12/20/2020. Patient reported biopsy site within normal limits with slight tenderness at the site, and no significant bruising. Post biopsy care instructions were reviewed, questions were answered and my direct phone number was provided to patient. Patient was instructed to call [facility] if any concerns or questions arise related to the biopsy. Recommendations: 1. Surgical consultation. Request for surgical consultation relayed to Al Pimple RN and Tanya Nones RN at [facility] by Electa Sniff RN on 12/20/2020. 2. Recommend bilateral breast MRI without and with contrast given patient's age. Consider spot magnification views of the breast to better evaluate for any residual clustered calcifications at time of localization for surgery if breast conservation surgery is being considered. Pathology results reported by Electa Sniff RN on 12/20/2020. Electronically Signed   By: Valentino Saxon MD   On: 12/20/2020 13:31   Result Date: 12/20/2020 CLINICAL DATA:  Indeterminate calcifications. EXAM: RIGHT BREAST STEREOTACTIC CORE NEEDLE BIOPSY COMPARISON:  Previous exams. FINDINGS: The patient and I discussed the procedure of stereotactic-guided biopsy including benefits and alternatives. We discussed the high likelihood of a successful procedure. We discussed the risks of the procedure including infection, bleeding, tissue injury, clip migration, and inadequate sampling. Informed written consent was given. The usual time out protocol was performed immediately prior to the procedure. Using sterile technique and 1% lidocaine and 1% lidocaine with epinephrine as local anesthetic, under stereotactic guidance, a 9 gauge  petite vacuum assisted device was used to perform core needle biopsy of calcifications in the upper inner quadrant of the RIGHT breast using a superior approach. Specimen radiograph was performed showing representative calcifications in 2 of 16 samples. Specimens with calcifications are identified for pathology. Lesion quadrant: Upper inner quadrant At the conclusion of the procedure, an X tissue marker clip was deployed into the biopsy cavity. Follow-up 2-view mammogram was performed and dictated separately. IMPRESSION: Stereotactic-guided biopsy of indeterminate calcifications. No apparent complications. Electronically Signed: By: Valentino Saxon MD On: 12/19/2020 13:44    Assessment and plan- Patient is a 45 y.o. female with newly diagnosed right breast DCIS ER positive  Discussed that standard of care for ER positive DCIS would be lumpectomy followed by adjuvant radiation therapy followed by 5 years of hormone therapy.  Given that patient is premenopausal she would be a candidate for 5 years of tamoxifen.  I will discuss hormone therapy in greater detail after final pathology is back.  Discussed MSKCC algorithm to calculate the risk of recurrence in the future post surgery as well.  Also gave her the option of AFT, trial but patient declines any active surveillance at this time and would like to proceed with definitive lumpectomy.  Treatment will be given with a curative intent.  I will also refer her to genetic counseling at this time.  I will tentatively see her back in 4 weeks after lumpectomy to discuss final pathology results and further management  Thank you for this kind referral and the opportunity to participate in the care of this patient   Visit Diagnosis 1. Ductal carcinoma in situ (DCIS) of right breast     Dr. Randa Evens, MD, MPH [facility] at [facility] [phone] 12/25/2020  2:38 PM

## 2020-12-26 ENCOUNTER — Other Ambulatory Visit: Payer: Self-pay | Admitting: General Surgery

## 2020-12-26 ENCOUNTER — Encounter: Payer: Self-pay | Admitting: Licensed Clinical Social Worker

## 2020-12-26 ENCOUNTER — Inpatient Hospital Stay (HOSPITAL_BASED_OUTPATIENT_CLINIC_OR_DEPARTMENT_OTHER): Payer: Managed Care, Other (non HMO) | Admitting: Licensed Clinical Social Worker

## 2020-12-26 ENCOUNTER — Other Ambulatory Visit: Payer: Self-pay

## 2020-12-26 DIAGNOSIS — D0511 Intraductal carcinoma in situ of right breast: Secondary | ICD-10-CM

## 2020-12-26 DIAGNOSIS — R928 Other abnormal and inconclusive findings on diagnostic imaging of breast: Secondary | ICD-10-CM

## 2020-12-26 DIAGNOSIS — Z800 Family history of malignant neoplasm of digestive organs: Secondary | ICD-10-CM | POA: Insufficient documentation

## 2020-12-26 NOTE — Progress Notes (Signed)
REFERRING PROVIDER: Sindy Guadeloupe, MD [place],  [place] [address]  PRIMARY PROVIDER:  Virginia Crews, MD  PRIMARY REASON FOR VISIT:  1. Ductal carcinoma in situ (DCIS) of right breast   2. Family history of colon cancer      HISTORY OF PRESENT ILLNESS:   Ebony Steele, a 45 y.o. female, was seen for a [place] cancer genetics consultation at the request of Dr. Janese Banks due to a personal and family history of cancer.  Ebony Steele presents to clinic today to discuss the possibility of a hereditary predisposition to cancer, genetic testing, and to further clarify her future cancer risks, as well as potential cancer risks for family members.   In 2022, at the Steele of 70, Ebony Steele was diagnosed with DCIS of the right breast. The treatment plan includes lumpectomy scheduled for 01/09/2021 followed by radiation. Ebony Steele does not feel she would change her surgical plan based on genetic test results.   CANCER HISTORY:  Oncology History   No history exists.     RISK FACTORS:  Menarche was at Steele 22.  First live birth at Steele 42.  OCP use for approximately 3 years.  Ovaries intact: has 1 ovary.  Hysterectomy: no.  Menopausal status: premenopausal.  HRT use: 0 years. Colonoscopy: no; not examined. Mammogram within the last year: yes. Number of breast biopsies: 1. Up to date with pelvic exams: yes. Any excessive radiation exposure in the past: no  Past Medical History:  Diagnosis Date  . Asthma   . BV (bacterial vaginosis)   . Family history of colon cancer   . Hypertension   . Menometrorrhagia   . Thyroid disease     Past Surgical History:  Procedure Laterality Date  . BREAST BIOPSY Right 12/19/2020   stereo bx/ x clip/ path pending  . DILATION AND CURETTAGE OF UTERUS  2005  . LAPAROSCOPY  2000  . OOPHORECTOMY Right 2000   Tertoma  . TUBAL LIGATION      Social History   Socioeconomic History  . Marital status: Married    Spouse name: Not on  file  . Number of children: 3  . Years of education: College  . Highest education level: Not on file  Occupational History  . 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 Smoking status: Current Every Day Smoker    Types: Cigars  . Smokeless tobacco: Never Used  . Tobacco comment: Smoked 1 cigar per day  Vaping Use  . Vaping Use: Never used  Substance and Sexual Activity  . Alcohol use: Yes    Comment: Occasional Wine  . Drug use: No    Types: Marijuana    Comment: Pt reports using Marijuana to help with depression symptoms.  She stopped  the end of May 2018  . Sexual activity: Yes    Birth control/protection: I.U.D.    Comment: Mirena  Other Topics Concern  . Not on file  Social History Narrative  . Not on file   Social Determinants of Health   Financial Resource Strain: Not on file  Food Insecurity: Not on file  Transportation Needs: Not on file  Physical Activity: Not on file  Stress: Not on file  Social Connections: Not on file     FAMILY HISTORY:  We obtained a detailed, 4-generation family history.  Significant diagnoses are listed below: Family History  Problem Relation Steele of Onset  . Diabetes Mother   .  Depression Mother   . Hypertension Mother   . Glaucoma Mother   . Kidney disease Father   . Diabetes Father   . Diabetes Mellitus I Brother   . Colon cancer Maternal Aunt    Ebony Steele has 3 daughters, no cancers. She has 2 maternal half sisters, 1 maternal half brother, 1 paternal half brother, no cancers.  Ebony Steele mother is 83 with no history of cancer. Patient had approximately 10 maternal aunts/uncles. One aunt had colon cancer and died in her 23s. Another aunt died in her 46s-40s with an unknown cause. An uncle possibly had cancer and passed from it, unknown type. Maternal grandmother passed in her 79s, grandfather passed young due to a car accident.   Ebony Steele father died, unknown Steele, patient has  limited information about him and this side of the family. No known cancers.   Ebony Steele is unaware of previous family history of genetic testing for hereditary cancer risks. Patient's maternal ancestors are of African American descent, and paternal ancestors are of African American descent. There is no reported Ashkenazi Jewish ancestry. There is no known consanguinity.   GENETIC COUNSELING ASSESSMENT: Ebony Steele is a 45 y.o. female with a personal history of DCIS which is somewhat suggestive of a hereditary cancer syndrome and predisposition to cancer. We, therefore, discussed and recommended the following at today's visit.   DISCUSSION: We discussed that approximately 5-10% of breast cancer is hereditary  Most cases of hereditary breast cancer are associated with BRCA1/BRCA2 genes, although there are other genes associated with hereditary breast cancer as well including CHEK2, which also increases risk for colon cancer .  We discussed that testing is beneficial for several reasons including surgical decision-making for breast cancer, knowing about other cancer risks, identifying potential screening and risk-reduction options that may be appropriate, and to understand if other family members could be at risk for cancer and allow them to undergo genetic testing.   We reviewed the characteristics, features and inheritance patterns of hereditary cancer syndromes. We also discussed genetic testing, including the appropriate family members to test, the process of testing, insurance coverage and turn-around-time for results. We discussed the implications of a negative, positive and/or variant of uncertain significant result. We recommended Ebony Steele pursue genetic testing for the [facility] BRCAPlus+CancerNext-Expanded+RNA gene panel.   The CancerNext-Expanded + RNAinsight gene panel offered by [employer] and includes sequencing and rearrangement analysis for the following 77 genes: IP, ALK, APC*, ATM*,  AXIN2, BAP1, BARD1, BLM, BMPR1A, BRCA1*, BRCA2*, BRIP1*, CDC73, CDH1*,CDK4, CDKN1B, CDKN2A, CHEK2*, CTNNA1, DICER1, FANCC, FH, FLCN, GALNT12, KIF1B, LZTR1, MAX, MEN1, MET, MLH1*, MSH2*, MSH3, MSH6*, MUTYH*, NBN, NF1*, NF2, NTHL1, PALB2*, PHOX2B, PMS2*, POT1, PRKAR1A, PTCH1, PTEN*, RAD51C*, RAD51D*,RB1, RECQL, RET, SDHA, SDHAF2, SDHB, SDHC, SDHD, SMAD4, SMARCA4, SMARCB1, SMARCE1, STK11, SUFU, TMEM127, TP53*,TSC1, TSC2, VHL and XRCC2 (sequencing and deletion/duplication); EGFR, EGLN1, HOXB13, KIT, MITF, PDGFRA, POLD1 and POLE (sequencing only); EPCAM and GREM1 (deletion/duplication only).  Based on Ms. Johnson's personal history of cancer, she meets medical criteria for genetic testing. Despite that she meets criteria, she may still have an out of pocket cost. We discussed that if her out of pocket cost for testing is over $100, the laboratory will call and confirm whether she wants to proceed with testing.  If the out of pocket cost of testing is less than $100 she will be billed by the genetic testing laboratory.   PLAN: After considering the risks, benefits, and limitations, Ebony Steele provided informed consent to pursue  genetic testing and the blood sample was sent to [facility] for analysis of the BRCAPlus+CancerNext-Expanded+RNA panel. Results should be available within approximately 5-10 days' time, at which point they will be disclosed by telephone to Ebony Steele, as will any additional recommendations warranted by these results. Ebony Steele will receive a summary of her genetic counseling visit and a copy of her results once available. This information will also be available in Epic.   Ebony Steele questions were answered to her satisfaction today. Our contact information was provided should additional questions or concerns arise. Thank you for the referral and allowing [place] to share in the care of your patient.   Faith Rogue, MS, [facility] Genetic Counselor [email or web address] Phone:  [phone]  The patient was seen for a total of 30 minutes in face-to-face genetic counseling. Patient's husband was present as well as Magazine features editor.  Dr. Grayland Ormond was available for discussion regarding this case.   _______________________________________________________________________ For Office Staff:  Number of people involved in session: 3 Was an Intern/ student involved with case: yes

## 2020-12-30 ENCOUNTER — Ambulatory Visit: Payer: Self-pay | Admitting: General Surgery

## 2020-12-31 ENCOUNTER — Ambulatory Visit
Admission: RE | Admit: 2020-12-31 | Discharge: 2020-12-31 | Disposition: A | Discharge status: Home / Self Care | Payer: Managed Care, Other (non HMO) | Source: Ambulatory Visit | Attending: General Surgery | Admitting: General Surgery

## 2020-12-31 ENCOUNTER — Other Ambulatory Visit: Payer: Self-pay

## 2020-12-31 DIAGNOSIS — R928 Other abnormal and inconclusive findings on diagnostic imaging of breast: Secondary | ICD-10-CM | POA: Diagnosis present

## 2020-12-31 DIAGNOSIS — D0511 Intraductal carcinoma in situ of right breast: Secondary | ICD-10-CM | POA: Insufficient documentation

## 2021-01-01 ENCOUNTER — Encounter: Payer: Self-pay | Admitting: Family Medicine

## 2021-01-01 ENCOUNTER — Other Ambulatory Visit: Payer: Self-pay | Admitting: Family Medicine

## 2021-01-01 ENCOUNTER — Encounter: Payer: Self-pay | Admitting: *Deleted

## 2021-01-01 MED ORDER — BUPROPION HCL ER (XL) 300 MG PO TB24: 300.0000 mg | ORAL_TABLET | Freq: Every day | ORAL | 1 refills | Status: DC

## 2021-01-01 NOTE — Telephone Encounter (Signed)
Ok to increase wellbutrin to 300mg  daily. Please send new Rx #[id number]. Let patient know when it is sent. I have seen her mammo and will follow along with treatment. Thanks!

## 2021-01-02 ENCOUNTER — Telehealth: Payer: Self-pay | Admitting: Licensed Clinical Social Worker

## 2021-01-02 NOTE — Telephone Encounter (Signed)
Negative genetic testing on the [facility] (STAT) panel of 8 genes. These genes have the highest risks for breast cancer associated with them and are the ones often used to help with surgical decisions. 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 The remainder of genetic testing is still pending, I will call Ebony Steele once that comes back and discuss the results further at that time.

## 2021-01-03 ENCOUNTER — Other Ambulatory Visit
Admission: RE | Admit: 2021-01-03 | Discharge: 2021-01-03 | Disposition: A | Discharge status: Home / Self Care | Payer: Managed Care, Other (non HMO) | Source: Ambulatory Visit | Attending: General Surgery | Admitting: General Surgery

## 2021-01-03 ENCOUNTER — Other Ambulatory Visit: Payer: Self-pay

## 2021-01-03 DIAGNOSIS — Z01818 Encounter for other preprocedural examination: Secondary | ICD-10-CM | POA: Diagnosis not present

## 2021-01-03 HISTORY — DX: Gastro-esophageal reflux disease without esophagitis: K21.9

## 2021-01-03 NOTE — Patient Instructions (Signed)
Your procedure is scheduled on:  Wednesday 01/09/21.  Report to THE FIRST FLOOR REGISTRATION DESK IN THE MEDICAL MALL ON THE MORNING OF SURGERY FIRST, THEN YOU WILL CHECK IN AT THE SURGERY INFORMATION DESK LOCATED OUTSIDE THE SAME DAY SURGERY DEPARTMENT LOCATED ON 2ND FLOOR MEDICAL MALL ENTRANCE.  To find out your arrival time please call [phone] between 1PM - 3PM on Tuesday 01/08/21.   Remember: Instructions that are not followed completely may result in serious medical risk, up to and including death, or upon the discretion of your surgeon and anesthesiologist your surgery may need to be rescheduled.     __X__ 1. Do not eat food after midnight the night before your procedure.                 No gum chewing or hard candies. You may drink clear liquids up to 2 hours                 before you are scheduled to arrive for your surgery- DO NOT drink clear                 liquids within 2 hours of the start of your surgery.                 Clear Liquids include:  water, apple juice without pulp, clear carbohydrate                 drink such as Clearfast or Gatorade, Black Coffee or Tea (Do not add                 milk or creamer to coffee or tea).  __X__2.  On the morning of surgery brush your teeth with toothpaste and water, you may rinse your mouth with mouthwash if you wish.  Do not swallow any toothpaste or mouthwash.    __X__ 3.  No Alcohol for 24 hours before or after surgery.  __X__ 4.  Do Not Smoke or use e-cigarettes For 24 Hours Prior to Your Surgery.                 Do not use any chewable tobacco products for at least 6 hours prior to                 surgery.  __X__5.  Notify your doctor if there is any change in your medical condition      (cold, fever, infections).      Do NOT wear jewelry, make-up, hairpins, clips or nail polish. Do NOT wear lotions, powders, or perfumes.  Do NOT shave 48 hours prior to surgery. Men may shave face and neck. Do NOT bring valuables to  the hospital.     [facility] is not responsible for any belongings or valuables.   Contacts, dentures/partials or body piercings may not be worn into surgery. Bring a case for your contacts, glasses or hearing aids, a denture cup will be supplied.    Patients discharged the day of surgery will not be allowed to drive home.     __X__ Take these medicines the morning of surgery with A SIP OF WATER:     1. albuterol (VENTOLIN HFA)  2. buPROPion (WELLBUTRIN XL)  3. levothyroxine (SYNTHROID)  4. omeprazole (PRILOSEC)      __X__ Use Dial Soap the night before your surgery and the morning of your surgery.  __X__ Use inhalers on the day of surgery. Also bring the inhaler with you to the  hospital on the morning of surgery.  __X__ Stop Anti-inflammatories 7 days before surgery such as Advil, Ibuprofen, Motrin, BC or Goodies Powder, Naprosyn, Naproxen, Aleve, Aspirin, Meloxicam.   __X__Do not start taking any new herbal supplements or vitamins prior to your procedure.  __X__ Stop the following herbal supplements or vitamins:  ASHWAGANDHA      Wear comfortable clothing (specific to your surgery type) to the hospital.  Plan for stool softeners for home use; pain medications have a tendency to cause constipation. You can also help prevent constipation by eating foods high in fiber such as fruits and vegetables and drinking plenty of fluids as your diet allows.  After surgery, you can prevent lung complications by doing breathing exercises.Take deep breaths and cough every 1-2 hours. Your doctor may order a device called an Incentive Spirometer to help you take deep breaths.  Please call the [place] Department at [phone] if you have any questions about these instructions.

## 2021-01-08 ENCOUNTER — Other Ambulatory Visit: Payer: Self-pay | Admitting: Family Medicine

## 2021-01-08 ENCOUNTER — Encounter: Payer: Self-pay | Admitting: Licensed Clinical Social Worker

## 2021-01-08 ENCOUNTER — Ambulatory Visit: Payer: Self-pay | Admitting: Licensed Clinical Social Worker

## 2021-01-08 ENCOUNTER — Telehealth: Payer: Self-pay | Admitting: Licensed Clinical Social Worker

## 2021-01-08 DIAGNOSIS — Z800 Family history of malignant neoplasm of digestive organs: Secondary | ICD-10-CM

## 2021-01-08 DIAGNOSIS — Z1379 Encounter for other screening for genetic and chromosomal anomalies: Secondary | ICD-10-CM

## 2021-01-08 DIAGNOSIS — D0511 Intraductal carcinoma in situ of right breast: Secondary | ICD-10-CM

## 2021-01-08 MED ORDER — LACTATED RINGERS IV SOLN: INTRAVENOUS | Status: DC

## 2021-01-08 MED ORDER — CEFAZOLIN SODIUM-DEXTROSE 2-4 GM/100ML-% IV SOLN
2.0000 g | INTRAVENOUS | Status: AC
  Administered 2021-01-09: 2 g via INTRAVENOUS

## 2021-01-08 MED ORDER — ORAL CARE MOUTH RINSE: 15.0000 mL | Freq: Once | OROMUCOSAL | Status: AC

## 2021-01-08 MED ORDER — CHLORHEXIDINE GLUCONATE 0.12 % MT SOLN: 15.0000 mL | Freq: Once | OROMUCOSAL | Status: AC

## 2021-01-08 NOTE — Telephone Encounter (Signed)
Revealed that remainder of testing was negative.  Revealed that a VUS in PALB2 was identified. This normal result is reassuring and indicates that it is unlikely Ebony Steele's cancer is due to a hereditary cause.  It is unlikely that there is an increased risk of another cancer due to a mutation in one of these genes.  However, genetic testing is not perfect, and cannot definitively rule out a hereditary cause.  It will be important for her to keep in contact with genetics to learn if any additional testing may be needed in the future.

## 2021-01-08 NOTE — Progress Notes (Signed)
HPI:  Ms. Ebony Ebony Steele was previously seen in the [facility] due to a personal and family history of cancer and concerns regarding a hereditary predisposition to cancer. Please refer to our prior cancer genetics clinic note for more information regarding our discussion, assessment and recommendations, at the time. Ms. Ebony Ebony Steele recent genetic test results were disclosed to her, as were recommendations warranted by these results. These results and recommendations are discussed in more detail below.  CANCER HISTORY:  Oncology History   No history exists.    FAMILY HISTORY:  We obtained a detailed, 4-generation family history.  Significant diagnoses are listed below: Family History  Problem Relation Ebony Steele of Onset  . Diabetes Mother   . Depression Mother   . Hypertension Mother   . Glaucoma Mother   . Kidney disease Father   . Diabetes Father   . Diabetes Mellitus I Brother   . Colon cancer Maternal Aunt     Ms. Ebony Ebony Steele has 3 daughters, no cancers. She has 2 maternal half sisters, 1 maternal half brother, 1 paternal half brother, no cancers.  Ms. Ebony Ebony Steele mother is 24 with no history of cancer. Patient had approximately 10 maternal aunts/uncles. One aunt had colon cancer and died in her 56s. Another aunt died in her 13s-40s with an unknown cause. An uncle possibly had cancer and passed from it, unknown type. Maternal grandmother passed in her 46s, grandfather passed young due to a car accident.   Ms. Ebony Ebony Steele father died, unknown Ebony Steele, patient has limited information about him and this side of the family. No known cancers.   Ms. Ebony Ebony Steele is unaware of previous family history of genetic testing for hereditary cancer risks. Patient's maternal ancestors are of African American descent, and paternal ancestors are of African American descent. There is no reported Ashkenazi Jewish ancestry. There is no known consanguinity.    GENETIC TEST RESULTS: Genetic testing reported  out on 01/02/2021  through the [facility] (STAT) Panel, and on 01/07/2021 through the CancerNext-Expanded+RNA cancer panel found no pathogenic mutations.   The CancerNext-Expanded + RNAinsight gene panel offered by [employer] and includes sequencing and rearrangement analysis for the following 77 genes: IP, ALK, APC*, ATM*, AXIN2, BAP1, BARD1, BLM, BMPR1A, BRCA1*, BRCA2*, BRIP1*, CDC73, CDH1*,CDK4, CDKN1B, CDKN2A, CHEK2*, CTNNA1, DICER1, FANCC, FH, FLCN, GALNT12, KIF1B, LZTR1, MAX, MEN1, MET, MLH1*, MSH2*, MSH3, MSH6*, MUTYH*, NBN, NF1*, NF2, NTHL1, PALB2*, PHOX2B, PMS2*, POT1, PRKAR1A, PTCH1, PTEN*, RAD51C*, RAD51D*,RB1, RECQL, RET, SDHA, SDHAF2, SDHB, SDHC, SDHD, SMAD4, SMARCA4, SMARCB1, SMARCE1, STK11, SUFU, TMEM127, TP53*,TSC1, TSC2, VHL and XRCC2 (sequencing and deletion/duplication); EGFR, EGLN1, HOXB13, KIT, MITF, PDGFRA, POLD1 and POLE (sequencing only); EPCAM and GREM1 (deletion/duplication only).  The test report has been scanned into EPIC and is located under the Molecular Pathology section of the Results Review tab.  A portion of the result report is included below for reference.     We discussed with Ms. Ebony Ebony Steele that because current genetic testing is not perfect, it is possible there may be a gene mutation in one of these genes that current testing cannot detect, but that chance is small.  We also discussed, that there could be another gene that has not yet been discovered, or that we have not yet tested, that is responsible for the cancer diagnoses in the family. It is also possible there is a hereditary cause for the cancer in the family that Ms. Ebony Ebony Steele did not inherit and therefore was not identified in her testing.  Therefore, it is important to  remain in touch with cancer genetics in the future so that we can continue to offer Ms. Ebony Ebony Steele the most up to date genetic testing.    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   At this time, it is unknown if this variant is associated with increased cancer risk or if this is a normal finding, but most variants such as this get reclassified to being inconsequential. It should not be used to make medical management decisions. With time, we suspect the lab will determine the significance of this variant, if any. If we do learn more about it, we will try to contact Ms. Johnson to discuss it further. However, it is important to stay in touch with [place] periodically and keep the address and phone number up to date.  ADDITIONAL GENETIC TESTING: We discussed with Ms. Ebony Ebony Steele that her genetic testing was fairly extensive.  If there are genes identified to increase cancer risk that can be analyzed in the future, we would be happy to discuss and coordinate this testing at that time.    CANCER SCREENING RECOMMENDATIONS: Ms. Ebony Ebony Steele test result is considered negative (normal).  This means that we have not identified a hereditary cause for her  personal and family history of cancer at this time. Most cancers happen by chance and this negative test suggests that her cancer may fall into this category.    While reassuring, this does not definitively rule out a hereditary predisposition to cancer. It is still possible that there could be genetic mutations that are undetectable by current technology. There could be genetic mutations in genes that have not been tested or identified to increase cancer risk.  Therefore, it is recommended she continue to follow the cancer management and screening guidelines provided by her oncology and primary healthcare provider.   An individual's cancer risk and medical management are not determined by genetic test results alone. Overall cancer risk assessment incorporates additional factors, including personal medical history, family history, and any available genetic information that may result in a personalized plan for cancer prevention and  surveillance.  RECOMMENDATIONS FOR FAMILY MEMBERS:  Relatives in this family might be at some increased risk of developing cancer, over the general population risk, simply due to the family history of cancer.  We recommended female relatives in this family have a yearly mammogram beginning at Ebony Steele 15, or 52 years younger than the earliest onset of cancer, an annual clinical breast exam, and perform monthly breast self-exams. Female relatives in this family should also have a gynecological exam as recommended by their primary provider.  All family members should be referred for colonoscopy starting at Ebony Steele 34.   FOLLOW-UP: Lastly, we discussed with Ms. Ebony Ebony Steele that cancer genetics is a rapidly advancing field and it is possible that new genetic tests will be appropriate for her and/or her family members in the future. We encouraged her to remain in contact with cancer genetics on an annual basis so we can update her personal and family histories and let her know of advances in cancer genetics that may benefit this family.   Our contact number was provided. Ms. Ebony Ebony Steele questions were answered to her satisfaction, and she knows she is welcome to call us at anytime with additional questions or concerns.   Faith Rogue, MS, [facility] Genetic Counselor [email or web address] Phone: [phone]

## 2021-01-09 ENCOUNTER — Encounter
Admission: RE | Disposition: A | Discharge status: Home / Self Care | Payer: Self-pay | Source: Ambulatory Visit | Attending: General Surgery

## 2021-01-09 ENCOUNTER — Ambulatory Visit: Payer: Managed Care, Other (non HMO) | Admitting: Anesthesiology

## 2021-01-09 ENCOUNTER — Ambulatory Visit
Admission: RE | Admit: 2021-01-09 | Discharge: 2021-01-09 | Disposition: A | Discharge status: Home / Self Care | Payer: Managed Care, Other (non HMO) | Source: Ambulatory Visit | Attending: General Surgery | Admitting: General Surgery

## 2021-01-09 ENCOUNTER — Other Ambulatory Visit: Payer: Self-pay

## 2021-01-09 ENCOUNTER — Encounter: Payer: Self-pay | Admitting: General Surgery

## 2021-01-09 DIAGNOSIS — Z87891 Personal history of nicotine dependence: Secondary | ICD-10-CM | POA: Diagnosis not present

## 2021-01-09 DIAGNOSIS — D0511 Intraductal carcinoma in situ of right breast: Secondary | ICD-10-CM

## 2021-01-09 DIAGNOSIS — Z886 Allergy status to analgesic agent status: Secondary | ICD-10-CM | POA: Diagnosis not present

## 2021-01-09 DIAGNOSIS — Z79899 Other long term (current) drug therapy: Secondary | ICD-10-CM | POA: Diagnosis not present

## 2021-01-09 DIAGNOSIS — R928 Other abnormal and inconclusive findings on diagnostic imaging of breast: Secondary | ICD-10-CM

## 2021-01-09 DIAGNOSIS — Z79890 Hormone replacement therapy (postmenopausal): Secondary | ICD-10-CM | POA: Diagnosis not present

## 2021-01-09 HISTORY — DX: Intraductal carcinoma in situ of right breast: D05.11

## 2021-01-09 HISTORY — PX: BREAST LUMPECTOMY: SHX2

## 2021-01-09 LAB — URINE DRUG SCREEN, QUALITATIVE (ARMC ONLY)
Amphetamines, Ur Screen: NOT DETECTED
Barbiturates, Ur Screen: NOT DETECTED
Benzodiazepine, Ur Scrn: NOT DETECTED
Cannabinoid 50 Ng, Ur ~~LOC~~: POSITIVE — AB
Cocaine Metabolite,Ur ~~LOC~~: NOT DETECTED
MDMA (Ecstasy)Ur Screen: NOT DETECTED
Methadone Scn, Ur: NOT DETECTED
Opiate, Ur Screen: NOT DETECTED
Phencyclidine (PCP) Ur S: NOT DETECTED
Tricyclic, Ur Screen: NOT DETECTED

## 2021-01-09 LAB — POCT PREGNANCY, URINE: Preg Test, Ur: NEGATIVE

## 2021-01-09 SURGERY — PARTIAL MASTECTOMY WITH RADIO FREQUENCY LOCALIZER
Anesthesia: General | Site: Breast | Laterality: Right

## 2021-01-09 MED ORDER — DEXMEDETOMIDINE (PRECEDEX) IN NS 20 MCG/5ML (4 MCG/ML) IV SYRINGE
PREFILLED_SYRINGE | INTRAVENOUS | Status: DC | PRN
  Administered 2021-01-09 (×3): 4 ug via INTRAVENOUS
  Administered 2021-01-09: 8 ug via INTRAVENOUS

## 2021-01-09 MED ORDER — ROCURONIUM BROMIDE 100 MG/10ML IV SOLN
INTRAVENOUS | Status: DC | PRN
  Administered 2021-01-09: 40 mg via INTRAVENOUS

## 2021-01-09 MED ORDER — MIDAZOLAM HCL 2 MG/2ML IJ SOLN
INTRAMUSCULAR | Status: AC
  Filled 2021-01-09: qty 2

## 2021-01-09 MED ORDER — OXYCODONE HCL 5 MG PO TABS: 5.0000 mg | ORAL_TABLET | Freq: Once | ORAL | Status: DC | PRN

## 2021-01-09 MED ORDER — CEFAZOLIN SODIUM-DEXTROSE 2-4 GM/100ML-% IV SOLN
INTRAVENOUS | Status: AC
  Filled 2021-01-09: qty 100

## 2021-01-09 MED ORDER — FENTANYL CITRATE (PF) 100 MCG/2ML IJ SOLN
INTRAMUSCULAR | Status: AC
  Filled 2021-01-09: qty 2

## 2021-01-09 MED ORDER — DEXMEDETOMIDINE (PRECEDEX) IN NS 20 MCG/5ML (4 MCG/ML) IV SYRINGE
PREFILLED_SYRINGE | INTRAVENOUS | Status: AC
  Filled 2021-01-09: qty 5

## 2021-01-09 MED ORDER — CHLORHEXIDINE GLUCONATE 0.12 % MT SOLN
OROMUCOSAL | Status: AC
  Administered 2021-01-09: 15 mL via OROMUCOSAL
  Filled 2021-01-09: qty 15

## 2021-01-09 MED ORDER — PROMETHAZINE HCL 25 MG/ML IJ SOLN: 6.2500 mg | INTRAMUSCULAR | Status: DC | PRN

## 2021-01-09 MED ORDER — PROPOFOL 1000 MG/100ML IV EMUL
INTRAVENOUS | Status: AC
  Filled 2021-01-09: qty 100

## 2021-01-09 MED ORDER — LACTATED RINGERS IV SOLN: INTRAVENOUS | Status: DC | PRN

## 2021-01-09 MED ORDER — OXYCODONE HCL 5 MG/5ML PO SOLN
5.0000 mg | Freq: Once | ORAL | Status: DC | PRN
Start: 2021-01-09 — End: 2021-01-09

## 2021-01-09 MED ORDER — PROPOFOL 500 MG/50ML IV EMUL
INTRAVENOUS | Status: DC | PRN
  Administered 2021-01-09: 100 ug/kg/min via INTRAVENOUS

## 2021-01-09 MED ORDER — FENTANYL CITRATE (PF) 100 MCG/2ML IJ SOLN
INTRAMUSCULAR | Status: DC | PRN
  Administered 2021-01-09 (×3): 50 ug via INTRAVENOUS

## 2021-01-09 MED ORDER — LIDOCAINE HCL (CARDIAC) PF 100 MG/5ML IV SOSY
PREFILLED_SYRINGE | INTRAVENOUS | Status: DC | PRN
  Administered 2021-01-09: 40 mg via INTRAVENOUS
  Administered 2021-01-09: 20 mg via INTRAVENOUS

## 2021-01-09 MED ORDER — MIDAZOLAM HCL 2 MG/2ML IJ SOLN
INTRAMUSCULAR | Status: DC | PRN
  Administered 2021-01-09: 2 mg via INTRAVENOUS

## 2021-01-09 MED ORDER — TRAMADOL HCL 50 MG PO TABS: 50.0000 mg | ORAL_TABLET | Freq: Four times a day (QID) | ORAL | 0 refills | Status: DC | PRN

## 2021-01-09 MED ORDER — FENTANYL CITRATE (PF) 100 MCG/2ML IJ SOLN: 25.0000 ug | INTRAMUSCULAR | Status: DC | PRN

## 2021-01-09 MED ORDER — ONDANSETRON HCL 4 MG/2ML IJ SOLN
INTRAMUSCULAR | Status: DC | PRN
  Administered 2021-01-09: 4 mg via INTRAVENOUS

## 2021-01-09 MED ORDER — BUPIVACAINE-EPINEPHRINE (PF) 0.5% -1:200000 IJ SOLN
INTRAMUSCULAR | Status: AC
  Filled 2021-01-09: qty 30

## 2021-01-09 MED ORDER — SUGAMMADEX SODIUM 200 MG/2ML IV SOLN
INTRAVENOUS | Status: DC | PRN
  Administered 2021-01-09: 200 mg via INTRAVENOUS

## 2021-01-09 MED ORDER — BUPIVACAINE-EPINEPHRINE (PF) 0.5% -1:200000 IJ SOLN
INTRAMUSCULAR | Status: DC | PRN
  Administered 2021-01-09: 10 mL via PERINEURAL
  Administered 2021-01-09: 20 mL via PERINEURAL

## 2021-01-09 MED ORDER — DEXAMETHASONE SODIUM PHOSPHATE 10 MG/ML IJ SOLN
INTRAMUSCULAR | Status: DC | PRN
  Administered 2021-01-09: 8 mg via INTRAVENOUS

## 2021-01-09 MED ORDER — PROPOFOL 10 MG/ML IV BOLUS
INTRAVENOUS | Status: AC
  Filled 2021-01-09: qty 20

## 2021-01-09 MED ORDER — PROPOFOL 10 MG/ML IV BOLUS
INTRAVENOUS | Status: DC | PRN
  Administered 2021-01-09: 50 mg via INTRAVENOUS
  Administered 2021-01-09: 20 mg via INTRAVENOUS
  Administered 2021-01-09: 130 mg via INTRAVENOUS

## 2021-01-09 MED ORDER — SEVOFLURANE IN SOLN
RESPIRATORY_TRACT | Status: AC
  Filled 2021-01-09: qty 250

## 2021-01-09 SURGICAL SUPPLY — 46 items
ADH SKN CLS APL DERMABOND .7 (GAUZE/BANDAGES/DRESSINGS) ×1
APL PRP STRL LF DISP 70% ISPRP (MISCELLANEOUS) ×1
BLADE SURG 15 STRL LF DISP TIS (BLADE) ×1 IMPLANT
BLADE SURG 15 STRL SS (BLADE) ×2
CANISTER SUCT 1200ML W/VALVE (MISCELLANEOUS) ×1 IMPLANT
CHLORAPREP W/TINT 26 (MISCELLANEOUS) ×2 IMPLANT
CNTNR SPEC 2.5X3XGRAD LEK (MISCELLANEOUS)
CONT SPEC 4OZ STER OR WHT (MISCELLANEOUS)
CONT SPEC 4OZ STRL OR WHT (MISCELLANEOUS)
CONTAINER SPEC 2.5X3XGRAD LEK (MISCELLANEOUS) ×1 IMPLANT
COVER WAND RF STERILE (DRAPES) ×1 IMPLANT
DERMABOND ADVANCED (GAUZE/BANDAGES/DRESSINGS) ×1
DERMABOND ADVANCED .7 DNX12 (GAUZE/BANDAGES/DRESSINGS) ×1 IMPLANT
DEVICE DUBIN SPECIMEN MAMMOGRA (MISCELLANEOUS) ×2 IMPLANT
DRAPE LAPAROTOMY TRNSV 106X77 (MISCELLANEOUS) ×2 IMPLANT
ELECT CAUTERY BLADE TIP 2.5 (TIP) ×2
ELECT REM PT RETURN 9FT ADLT (ELECTROSURGICAL) ×2
ELECTRODE CAUTERY BLDE TIP 2.5 (TIP) ×1 IMPLANT
ELECTRODE REM PT RTRN 9FT ADLT (ELECTROSURGICAL) ×1 IMPLANT
GLOVE SURG ENC MOIS LTX SZ6.5 (GLOVE) ×2 IMPLANT
GLOVE SURG UNDER POLY LF SZ6.5 (GLOVE) ×2 IMPLANT
GOWN STRL REUS W/ TWL LRG LVL3 (GOWN DISPOSABLE) ×3 IMPLANT
GOWN STRL REUS W/TWL LRG LVL3 (GOWN DISPOSABLE) ×6
KIT MARKER MARGIN INK (KITS) ×1 IMPLANT
KIT TURNOVER KIT A (KITS) ×2 IMPLANT
LABEL OR SOLS (LABEL) ×2 IMPLANT
MANIFOLD NEPTUNE II (INSTRUMENTS) ×2 IMPLANT
MARGIN MAP 10MM (MISCELLANEOUS) IMPLANT
MARKER MARGIN CORRECT CLIP (MARKER) ×1 IMPLANT
NDL HYPO 25X1 1.5 SAFETY (NEEDLE) ×1 IMPLANT
NEEDLE HYPO 25X1 1.5 SAFETY (NEEDLE) ×2 IMPLANT
PACK BASIN MINOR ARMC (MISCELLANEOUS) ×2 IMPLANT
RETRACTOR RING XSMALL (MISCELLANEOUS) IMPLANT
RTRCTR WOUND ALEXIS 13CM XS SH (MISCELLANEOUS) ×2
SET LOCALIZER 20 PROBE US (MISCELLANEOUS) ×2 IMPLANT
SUT ETHILON 3-0 FS-10 30 BLK (SUTURE)
SUT MNCRL 4-0 (SUTURE) ×2
SUT MNCRL 4-0 27XMFL (SUTURE) ×1
SUT SILK 2 0 SH (SUTURE) ×2 IMPLANT
SUT VIC AB 3-0 SH 27 (SUTURE) ×2
SUT VIC AB 3-0 SH 27X BRD (SUTURE) ×1 IMPLANT
SUTURE EHLN 3-0 FS-10 30 BLK (SUTURE) IMPLANT
SUTURE MNCRL 4-0 27XMF (SUTURE) ×1 IMPLANT
SYR 10ML LL (SYRINGE) ×2 IMPLANT
SYR BULB IRRIG 60ML STRL (SYRINGE) ×2 IMPLANT
WATER STERILE IRR 1000ML POUR (IV SOLUTION) ×2 IMPLANT

## 2021-01-09 NOTE — Discharge Instructions (Addendum)
AMBULATORY SURGERY  DISCHARGE INSTRUCTIONS   1) The drugs that you were given will stay in your system until tomorrow so for the next 24 hours you should not:  A) Drive an automobile B) Make any legal decisions C) Drink any alcoholic beverage   2) You may resume regular meals tomorrow.  Today it is better to start with liquids and gradually work up to solid foods.  You may eat anything you prefer, but it is better to start with liquids, then soup and crackers, and gradually work up to solid foods.   3) Please notify your doctor immediately if you have any unusual bleeding, trouble breathing, redness and pain at the surgery site, drainage, fever, or pain not relieved by medication.    4) Additional Instructions:        Please contact your physician with any problems or Same Day Surgery at [phone], Monday through Friday 6 am to 4 pm, or [facility] number at [phone]. Diet: Resume home heart healthy regular diet.   Activity: No heavy lifting >20 pounds (children, pets, laundry, garbage) or strenuous activity until follow-up, but light activity and walking are encouraged. Do not drive or drink alcohol if taking narcotic pain medications.  Wound care: May shower with soapy water and pat dry (do not rub incisions), but no baths or submerging incision underwater until follow-up. (no swimming)   Medications: Resume all home medications. For mild to moderate pain: acetaminophen (Tylenol) or ibuprofen (if no kidney disease). Combining Tylenol with alcohol can substantially increase your risk of causing liver disease. Narcotic pain medications, if prescribed, can be used for severe pain, though may cause nausea, constipation, and drowsiness. Do not combine Tylenol and Norco within a 6 hour period as Norco contains Tylenol. If you do not need the narcotic pain medication, you do not need to fill the prescription.  Call office ([phone]) at any time if any  questions, worsening pain, fevers/chills, bleeding, drainage from incision site, or other concerns.  

## 2021-01-09 NOTE — Op Note (Signed)
Preoperative diagnosis: Right breast carcinoma.  Postoperative diagnosis: Right breast carcinoma.   Procedure: Right radiofrequency tag-localized partial mastectomy.   Anesthesia: GETA  Surgeon: Dr. Windell Moment  Wound Classification: Clean  Indications: Patient is a 45 y.o. 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   Findings: 1. Specimen mammography shows marker and tag on specimen 2. Pathology call refers gross examination of margins was clear 3. No other palpable mass or lymph node identified.   Description of procedure: Preoperative radiofrequency tag localization was performed by radiology.  Localization studies were reviewed. The patient was taken to the operating room and placed supine on the operating table, and after general anesthesia the right chest and axilla were prepped and draped in the usual sterile fashion. A time-out was completed verifying correct patient, procedure, site, positioning, and implant(s) and/or special equipment prior to beginning this procedure.  By comparing the localization studies and interrogation with Localizer device, the probable trajectory and location of the mass was visualized. A circumareolar skin incision was planned in such a way as to minimize the amount of dissection to reach the mass.  The skin incision was made. Flaps were raised and the location of the tag was confirmed with Localizer device confirmed. A 2-0 silk figure-of-eight stay suture was placed and used for retraction. Dissection was then taken down circumferentially, taking care to include the entire localizing tag and a wide margin of grossly normal tissue. The specimen and entire localizing tag were removed. The specimen was oriented and sent to radiology with the localization studies. Confirmation was received that the entire target lesion had been  resected. The wound was irrigated. Hemostasis was checked.   The breast wound was then approached for closure.  Eliminate dead space a tissue transfer technique was utilized.  The breast and pectoralis fascia was elevated off the underlying muscle and the serratus muscle circumferentially for a distance of about 30 sq centimeters.  The fascial layer was then approximated with interrupted 2-0 Vicryl sutures.  The superficial layer of the breast parenchyma was then approximated in a similar fashion.  This was done in a radial direction.  The skin flaps were then elevated circumferentially to remove a ripple noted superiorly and medially. The skin was closed with 4-0 Monocryl. Dermabond was applied.  Specimen: Right Breast mass                     Complications: None  Estimated Blood Loss: 10 mL

## 2021-01-09 NOTE — Anesthesia Procedure Notes (Signed)
Procedure Name: Intubation Date/Time: 01/09/2021 12:34 PM Performed by: Allean Found, CRNA Pre-anesthesia Checklist: Patient identified, Patient being monitored, Timeout performed, Emergency Drugs available and Suction available Patient Re-evaluated:Patient Re-evaluated prior to induction Oxygen Delivery Method: Circle system utilized Preoxygenation: Pre-oxygenation with 100% oxygen Induction Type: IV induction Ventilation: Mask ventilation without difficulty Laryngoscope Size: 3 and McGraph Grade View: Grade I Tube type: Oral Tube size: 7.0 mm Number of attempts: 1 Airway Equipment and Method: Stylet Placement Confirmation: ETT inserted through vocal cords under direct vision,  positive ETCO2 and breath sounds checked- equal and bilateral Secured at: 21 cm Tube secured with: Tape Dental Injury: Teeth and Oropharynx as per pre-operative assessment

## 2021-01-09 NOTE — Anesthesia Postprocedure Evaluation (Signed)
Anesthesia Post Note  Patient: Ebony Steele  Procedure(s) Performed: PARTIAL MASTECTOMY WITH RADIO FREQUENCY LOCALIZER (Right Breast)  Patient location during evaluation: PACU Anesthesia Type: General Level of consciousness: awake and alert, awake and oriented Pain management: pain level controlled Vital Signs Assessment: post-procedure vital signs reviewed and stable Respiratory status: spontaneous breathing, nonlabored ventilation and respiratory function stable Cardiovascular status: blood pressure returned to baseline and stable Postop Assessment: no apparent nausea or vomiting Anesthetic complications: no   No complications documented.   Last Vitals:  Vitals:   01/09/21 1430 01/09/21 1458  BP: 133/83 (!) 145/82  Pulse: 69 65  Resp: 15 15  Temp: 36.4 C (!) 36.3 C  SpO2: 100% 100%    Last Pain:  Vitals:   01/09/21 1458  TempSrc: Temporal  PainSc: 0-No pain                 Phill Mutter

## 2021-01-09 NOTE — Anesthesia Preprocedure Evaluation (Addendum)
Anesthesia Evaluation  Patient identified by MRN, date of birth, ID band Patient awake    Reviewed: Allergy & Precautions, H&P , NPO status , Patient's Chart, lab work & pertinent test results  History of Anesthesia Complications History of anesthetic complications: +motion sickness.  Airway Mallampati: II  TM Distance: >3 FB Neck ROM: full    Dental  (+) Missing   Pulmonary asthma , neg sleep apnea, neg COPD, Current Smoker and Patient abstained from smoking.,    breath sounds clear to auscultation       Cardiovascular (-) angina(-) Past MI and (-) Cardiac Stents negative cardio ROS  (-) dysrhythmias  Rhythm:regular Rate:Normal     Neuro/Psych  Headaches, PSYCHIATRIC DISORDERS Anxiety Depression    GI/Hepatic Neg liver ROS, GERD  Controlled and Medicated,  Endo/Other  Hypothyroidism   Renal/GU      Musculoskeletal   Abdominal   Peds  Hematology negative hematology ROS (+)   Anesthesia Other Findings Past Medical History: No date: Asthma No date: BV (bacterial vaginosis) No date: Family history of colon cancer No date: GERD (gastroesophageal reflux disease) No date: Menometrorrhagia No date: Thyroid disease  Past Surgical History: 12/19/2020: BREAST BIOPSY; Right     Comment:  stereo bx/ x clip/ path pending 2005: [place] OF UTERUS 2000: LAPAROSCOPY 2000: OOPHORECTOMY; Right     Comment:  Tertoma No date: TUBAL LIGATION     Reproductive/Obstetrics negative OB ROS                           Anesthesia Physical Anesthesia Plan  ASA: II  Anesthesia Plan: General LMA   Post-op Pain Management:    Induction:   PONV Risk Score and Plan: Dexamethasone, Ondansetron, Midazolam, Treatment may vary due to age or medical condition and Propofol infusion  Airway Management Planned:   Additional Equipment:   Intra-op Plan:   Post-operative Plan:   Informed  Consent: I have reviewed the patients History and Physical, chart, labs and discussed the procedure including the risks, benefits and alternatives for the proposed anesthesia with the patient or authorized representative who has indicated his/her understanding and acceptance.     Dental Advisory Given  Plan Discussed with: Anesthesiologist, CRNA and Surgeon  Anesthesia Plan Comments:       Anesthesia Quick Evaluation

## 2021-01-09 NOTE — Transfer of Care (Signed)
Immediate Anesthesia Transfer of Care Note  Patient: Ebony Steele  Procedure(s) Performed: PARTIAL MASTECTOMY WITH RADIO FREQUENCY LOCALIZER (Right Breast)  Patient Location: PACU  Anesthesia Type:General  Level of Consciousness: awake and oriented  Airway & Oxygen Therapy: Patient Spontanous Breathing and Patient connected to face mask oxygen  Post-op Assessment: Report given to RN and Post -op Vital signs reviewed and stable  Post vital signs: Reviewed and stable  Last Vitals:  Vitals Value Taken Time  BP 130/85 01/09/21 1415  Temp 36.4 C 01/09/21 1412  Pulse 74 01/09/21 1422  Resp 17 01/09/21 1422  SpO2 100 % 01/09/21 1422  Vitals shown include unvalidated device data.  Last Pain:  Vitals:   01/09/21 1101  PainSc: 0-No pain         Complications: No complications documented.

## 2021-01-09 NOTE — H&P (Signed)
PATIENT PROFILE: Ebony Steele is a 45 y.o. female who presents to the OR for surgical management of Right breast DCIS.  PCP: [facility], [facility]  HISTORY OF PRESENT ILLNESS: [name] reports she was initially evaluated by esterase in her right axillary area. The primary care physician order a diagnostic mammogram. The diagnostic mammogram showed a microcalcification on the right breast not related to the axillary cyst. This led to core needle biopsy. 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 I personally evaluated the images of the diagnostic mammogram.  Patient denies any breast pain, any palpable masses of the breast. There has been no nipple discharge or nipple retraction.  Family history of breast cancer: None Family history of other cancers: None Menarche: 45 years old LMP: 12/05/20  Used OCP: yes  History of Radiation to the chest: None Number of pregnancies: 5 Age of first pregnancy 16 No previous breast biopsy No history of radiation therapy  PROBLEM LIST: Problem List Date Reviewed: 05/04/2014  Noted  Hypothyroidism Unknown  HTN (hypertension) Unknown    GENERAL REVIEW OF SYSTEMS:   General ROS: negative for - chills, fatigue, fever, weight gain or weight loss Allergy and Immunology ROS: negative for - hives  Hematological and Lymphatic ROS: negative for - bleeding problems or bruising, negative for palpable nodes Endocrine ROS: negative for - heat or cold intolerance, hair changes Respiratory ROS: negative for - cough, shortness of breath or wheezing Cardiovascular ROS: no chest pain or palpitations GI ROS: negative for nausea, vomiting, abdominal pain, diarrhea, constipation Musculoskeletal ROS: negative for - joint swelling or muscle pain Neurological ROS: negative for - confusion, syncope Dermatological ROS: negative for pruritus and rash Psychiatric: negative for anxiety, depression, difficulty sleeping and memory  loss  MEDICATIONS: Current Outpatient Medications  Medication Sig Dispense Refill  . buPROPion (WELLBUTRIN XL) 150 MG XL tablet Take 1 tablet by mouth once daily  . levothyroxine (SYNTHROID) 88 MCG tablet Take 88 mcg by mouth once daily Take on an empty stomach with a glass of water at least 30-60 minutes before breakfast.  . omeprazole (PRILOSEC) 40 MG DR capsule Take by mouth as needed  . traZODone (DESYREL) 50 MG tablet Take by mouth  . albuterol 90 mcg/actuation inhaler Inhale 2 inhalations into the lungs every 4 (four) hours as needed for Wheezing. 1 Inhaler 0  . benzonatate (TESSALON) 100 MG capsule Take by mouth (Patient not taking: Reported on 12/24/2020)  . fluticasone (FLONASE) 50 mcg/actuation nasal spray Place 1 spray into both nostrils 2 (two) times daily. (Patient not taking: No sig reported) 16 g 0  . ondansetron (ZOFRAN-ODT) 4 MG disintegrating tablet Take 1 tablet (4 mg total) by mouth every 8 (eight) hours as needed for Nausea. (Patient not taking: Reported on 12/24/2020) 20 tablet 0   No current facility-administered medications for this visit.   ALLERGIES: Tylenol [acetaminophen]  PAST MEDICAL HISTORY: Past Medical History:  Diagnosis Date  . Anxiety  . Constipation  . Depression  . Ductal carcinoma in situ (DCIS) of right breast  . HTN (hypertension)  . Hypothyroidism   PAST SURGICAL HISTORY: History reviewed. No pertinent surgical history.   FAMILY HISTORY: Family History  Problem Relation Age of Onset  . Diabetes type II Mother  . High blood pressure (Hypertension) Mother  . Diabetes type II Father  . Diabetes type II Brother    SOCIAL HISTORY: Social History   Socioeconomic History  . Marital status: Married  Tobacco Use  . Smoking status:  Former Smoker  Quit date: 12/20/2020  Years since quitting: 0.0  . Smokeless tobacco: Never Used  Vaping Use  . Vaping Use: Never used  Substance and Sexual Activity  . Alcohol use: Yes  Comment:  occasionally  . Drug use: Never   PHYSICAL EXAM: Vitals:  12/24/20 0806  BP: (!) 150/94  Pulse: 84   Body mass index is 23.34 kg/m. Weight: 61.7 kg (136 lb)   GENERAL: Alert, active, oriented x3  HEENT: Pupils equal reactive to light. Extraocular movements are intact. Sclera clear. Palpebral conjunctiva normal red color.Pharynx clear.  NECK: Supple with no palpable mass and no adenopathy.  LUNGS: Sound clear with no rales rhonchi or wheezes.  HEART: Regular rhythm S1 and S2 without murmur.  BREAST: breasts appear normal, no suspicious masses, no skin or nipple changes or axillary nodes.  ABDOMEN: Soft and depressible, nontender with no palpable mass, no hepatomegaly.  EXTREMITIES: Well-developed well-nourished symmetrical with no dependent edema.  NEUROLOGICAL: Awake alert oriented, facial expression symmetrical, moving all extremities.  REVIEW OF DATA: I have reviewed the following data today: No visits with results within 3 Month(s) from this visit.  Latest known visit with results is:  Initial consult on 12/09/2018  Component Date Value  . Influenza A PCR 12/09/2018 Negative  . Influenza B PCR 12/09/2018 Negative  . Rapid Strep A Screen 12/09/2018 Negative    ASSESSMENT: [name] is a 45 y.o. female presenting for surgical management for right breast DCIS, low-grade.   Patient was oriented again about the pathology results. Surgical alternatives were discussed with patient including partial vs total mastectomy. Surgical technique and post operative care was discussed with patient. Risk of surgery was discussed with patient including but not limited to: wound infection, seroma, hematoma, brachial plexopathy, mondor's disease (thrombosis of small veins of breast), chronic wound pain, breast lymphedema, altered sensation to the nipple and cosmesis among others.   Patient has a low-grade DCIS of the right breast. We discussed about the surgical management alternative  of DCIS. She was evaluated by medical oncology and agreed to proceed with partial mastectomy.  Ductal carcinoma in situ (DCIS) of right breast [D05.11]  PLAN: 1. Right breast radiofrequency tagged partial mastectomy    Electronically signed by Herbert Pun, MD

## 2021-01-14 ENCOUNTER — Other Ambulatory Visit: Payer: Self-pay | Admitting: Anatomic Pathology & Clinical Pathology

## 2021-01-14 LAB — SURGICAL PATHOLOGY

## 2021-01-22 ENCOUNTER — Inpatient Hospital Stay: Payer: Managed Care, Other (non HMO) | Attending: Oncology | Admitting: Oncology

## 2021-01-22 ENCOUNTER — Other Ambulatory Visit: Payer: Self-pay

## 2021-01-22 ENCOUNTER — Encounter: Payer: Self-pay | Admitting: *Deleted

## 2021-01-22 ENCOUNTER — Ambulatory Visit
Admission: RE | Admit: 2021-01-22 | Discharge: 2021-01-22 | Disposition: A | Discharge status: Home / Self Care | Payer: Managed Care, Other (non HMO) | Source: Ambulatory Visit | Attending: Radiation Oncology | Admitting: Radiation Oncology

## 2021-01-22 ENCOUNTER — Encounter: Payer: Self-pay | Admitting: Oncology

## 2021-01-22 VITALS — BP 135/97 | HR 87 | Temp 99.9°F | Resp 16 | Ht 64.0 in | Wt 138.5 lb

## 2021-01-22 DIAGNOSIS — Z87891 Personal history of nicotine dependence: Secondary | ICD-10-CM | POA: Insufficient documentation

## 2021-01-22 DIAGNOSIS — Z170 Estrogen receptor positive status [ER+]: Secondary | ICD-10-CM | POA: Diagnosis not present

## 2021-01-22 DIAGNOSIS — D0511 Intraductal carcinoma in situ of right breast: Secondary | ICD-10-CM

## 2021-01-22 DIAGNOSIS — Z7189 Other specified counseling: Secondary | ICD-10-CM

## 2021-01-22 NOTE — Progress Notes (Signed)
Pt here to go over the options with breast cancer and make plan for caring for her with DCIS

## 2021-01-22 NOTE — Progress Notes (Signed)
Met patient in the clinic today.  Plan for radiation therapy and antihormonal therapy.  No needs at this time.

## 2021-01-22 NOTE — Progress Notes (Signed)
Hematology/Oncology Consult note [facility]  Telephone:([phone] [phone]  Patient Care Team: Virginia Crews, MD as PCP - General (Family Medicine)   Name of the patient: Ebony Steele  [id number]  1975-09-23   Date of visit: 01/22/21  Diagnosis-right breast DCIS ER positive  Chief complaint/ Reason for visit-discuss final pathology results and further management  Heme/Onc history: patient is a 45 year old premenopausal African-American female who was noted to have a palpable abnormality in her right axilla for which she underwent a bilateral diagnostic mammogram.  This was her first mammogram with no prior abnormal mammograms or breast biopsies.  Mammogram showed abnormal calcifications 0.3 x 1.6 x 0.4 cm in the upper inner quadrant of the right breast.  Intradermal hypoechoic collection in the right axilla measuring 0.8 x 0.4 x 0.7 cm consistent with a benign intradermal inclusion cyst.  Her abnormal calcifications were biopsied and was consistent with low-grade DCIS ER positive.She has met with Dr. Peyton Najjar and plans to undergo lumpectomy in the near future.  No prior use of birth control.  No family history of breast, ovarian, gastric: Or pancreatic cancer.  No family history of melanomas  Final pathology showed 5 mm grade 1 low-grade DCIS with negative margins  Interval history-patient is doing well post surgery.  She will be meeting with radiation oncology today.  She has done her own research on risks and benefits of tamoxifen and is leaning against it.  States that she is not someone who can take medications regularly and barely keeps up with her thyroid and antidepression medications.  ECOG PS- 0 Pain scale- 0   Review of systems- Review of Systems  Constitutional: Negative for chills, fever, malaise/fatigue and weight loss.  HENT: Negative for congestion, ear discharge and nosebleeds.   Eyes: Negative for blurred vision.   Respiratory: Negative for cough, hemoptysis, sputum production, shortness of breath and wheezing.   Cardiovascular: Negative for chest pain, palpitations, orthopnea and claudication.  Gastrointestinal: Negative for abdominal pain, blood in stool, constipation, diarrhea, heartburn, melena, nausea and vomiting.  Genitourinary: Negative for dysuria, flank pain, frequency, hematuria and urgency.  Musculoskeletal: Negative for back pain, joint pain and myalgias.  Skin: Negative for rash.  Neurological: Negative for dizziness, tingling, focal weakness, seizures, weakness and headaches.  Endo/Heme/Allergies: Does not bruise/bleed easily.  Psychiatric/Behavioral: Negative for depression and suicidal ideas. The patient does not have insomnia.       Allergies  Allergen Reactions  . Acetaminophen Anaphylaxis     Past Medical History:  Diagnosis Date  . Asthma   . Breast cancer ([place])   . BV (bacterial vaginosis)   . Family history of colon cancer   . GERD (gastroesophageal reflux disease)   . Menometrorrhagia   . Thyroid disease      Past Surgical History:  Procedure Laterality Date  . BREAST BIOPSY Right 12/19/2020   stereo bx/ x clip/ path pending  . DILATION AND CURETTAGE OF UTERUS  2005  . LAPAROSCOPY  2000  . OOPHORECTOMY Right 2000   Tertoma  . TUBAL LIGATION      Social History   Socioeconomic History  . Marital status: Married    Spouse name: Not on file  . Number of children: 3  . Years of education: College  . Highest education level: Not on file  Occupational History  . 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 Smoking status: Former Smoker  Types: Cigars  . Smokeless tobacco: Never Used  Vaping Use  . Vaping Use: Never used  Substance and Sexual Activity  . Alcohol use: Yes    Comment: Occasional Wine  . Drug use: No    Types: Marijuana    Comment: Pt reports using Marijuana to help with depression symptoms.  She  stopped  the end of May 2018  . Sexual activity: Yes  Other Topics Concern  . Not on file  Social History Narrative  . Not on file   Social Determinants of Health   Financial Resource Strain: Not on file  Food Insecurity: Not on file  Transportation Needs: Not on file  Physical Activity: Not on file  Stress: Not on file  Social Connections: Not on file  Intimate Partner Violence: Not on file    Family History  Problem Relation Age of Onset  . Diabetes Mother   . Depression Mother   . Hypertension Mother   . Glaucoma Mother   . Kidney disease Father   . Diabetes Father   . Diabetes Mellitus I Brother   . Colon cancer Maternal Aunt      Current Outpatient Medications:  .  albuterol (VENTOLIN HFA) 108 (90 Base) MCG/ACT inhaler, Inhale 1-2 puffs into the lungs every 6 (six) hours as needed for wheezing or shortness of breath., Disp: , Rfl:  .  ASHWAGANDHA PO, Take 1 tablet by mouth daily. Goli Gummies, Disp: , Rfl:  .  buPROPion (WELLBUTRIN XL) 300 MG 24 hr tablet, Take 1 tablet (300 mg total) by mouth daily., Disp: 90 tablet, Rfl: 1 .  ibuprofen (ADVIL) 200 MG tablet, Take 200-400 mg by mouth every 6 (six) hours as needed for moderate pain., Disp: , Rfl:  .  levothyroxine (SYNTHROID) 88 MCG tablet, Take 1 tablet (88 mcg total) by mouth daily., Disp: 90 tablet, Rfl: 3 .  omeprazole (PRILOSEC) 40 MG capsule, Take 1 capsule (40 mg total) by mouth daily., Disp: 30 capsule, Rfl: 3 .  polyethylene glycol (MIRALAX / GLYCOLAX) 17 g packet, Take 17 g by mouth daily as needed., Disp: , Rfl:  .  traMADol (ULTRAM) 50 MG tablet, Take 1 tablet (50 mg total) by mouth every 6 (six) hours as needed., Disp: 20 tablet, Rfl: 0 .  traZODone (DESYREL) 50 MG tablet, Take 1-2 tablets (50-100 mg total) by mouth at bedtime as needed for sleep. (Patient taking differently: Take 50 mg by mouth at bedtime.), Disp: 60 tablet, Rfl: 3  Physical exam:  Vitals:   01/22/21 1339  BP: (!) 135/97  Pulse: 87   Resp: 16  Temp: 99.9 F (37.7 C)  TempSrc: Tympanic  Weight: 138 lb 8 oz (62.8 kg)  Height: '5\' 4"'  (1.626 m)   Physical Exam Cardiovascular:     Rate and Rhythm: Normal rate and regular rhythm.  Pulmonary:     Effort: Pulmonary effort is normal.  Skin:    General: Skin is warm and dry.  Neurological:     Mental Status: She is alert and oriented to person, place, and time.      CMP Latest Ref Rng & Units 09/10/2020  Glucose 65 - 99 mg/dL 84  BUN 6 - 24 mg/dL 10  Creatinine 0.57 - 1.00 mg/dL 0.88  Sodium 134 - 144 mmol/L 139  Potassium 3.5 - 5.2 mmol/L 4.1  Chloride 96 - 106 mmol/L 107(H)  CO2 20 - 29 mmol/L 20  Calcium 8.7 - 10.2 mg/dL 9.2  Total Protein 6.0 - 8.5  g/dL 7.3  Total Bilirubin 0.0 - 1.2 mg/dL 0.3  Alkaline Phos 44 - 121 IU/L 75  AST 0 - 40 IU/L 16  ALT 0 - 32 IU/L 14   CBC Latest Ref Rng & Units 09/10/2020  WBC 3.4 - 10.8 x10E3/uL 4.7  Hemoglobin 11.1 - 15.9 g/dL 13.8  Hematocrit 34.0 - 46.6 % 39.9  Platelets 150 - 450 x10E3/uL 193    No images are attached to the encounter.  MM Breast Surgical Specimen  Result Date: 01/09/2021 CLINICAL DATA:  Status post Savi Scout localized RIGHT breast lumpectomy. EXAM: SPECIMEN RADIOGRAPH OF THE RIGHT BREAST COMPARISON:  Previous exam(s). FINDINGS: Specimen was submitted postoperatively for interpretation. Status post excision of the RIGHT breast. [facility] and X shaped clip are present within the specimen. IMPRESSION: Specimen radiograph of the RIGHT breast. Electronically Signed   By: Margarette Canada M.D.   On: 01/09/2021 13:58   MM RT RADIO FREQUENCY TAG LOC MAMMO GUIDE  Result Date: 12/31/2020 CLINICAL DATA:  Right breast cancer for localizer placement prior to surgery EXAM: MAMMOGRAPHIC GUIDED [place] right BREAST COMPARISON:  Previous exam(s) FINDINGS: Patient presents for radiofrequency device localization prior to right breast surgery. I met with the patient and we  discussed the procedure of radiofrequency device localization including benefits and alternatives. We discussed the high likelihood of a successful procedure. We discussed the risks of the procedure including infection, bleeding, tissue injury and further surgery. Informed, written consent was given. The usual time-out protocol was performed immediately prior to the procedure. Using mammographic guidance, sterile technique, 1% lidocaine as local anesthesia, radiofrequency tag was used to localize X biopsy clip right breast using a cranial approach. The follow-up mammogram images confirm that the RF device is in the expected location and are marked for Dr. Windell Moment. Follow-up survey of the patient confirms the presence of the RF device. The patient tolerated the procedure well and was [facility] from the [facility]. IMPRESSION: Radiofrequency device localization of the right breast. No apparent complications. Electronically Signed   By: Abelardo Diesel M.D.   On: 12/31/2020 16:02     Assessment and plan- Patient is a 45 y.o. female with right breast DCIS ER positive here to discuss final pathology results and further management  Discussed the results of final pathology which showed a 5 mm grade 1 DCIS with negative margins.  Patient has undergone curative surgery for DCIS and is willing to proceed with adjuvant radiation therapy.  However she has concerns about taking tamoxifen for 5 years.  She understands the risks and benefits of tamoxifen including all but not limited to hot flashes, mood swings, risk of DVTs endometrial cancer and cataracts.  In general she is adverse to taking medications and she feels that the relative benefits of tamoxifen do not outweigh the possible risks of side effects for her.  She is willing to think over it and see me in 3 months and decide where we go from there.   Visit Diagnosis 1. Neoplasm of right breast, primary tumor staging category Tis: ductal carcinoma in situ  (DCIS)   2. Goals of care, counseling/discussion      Dr. Randa Evens, MD, MPH [facility] [phone] 01/22/2021 4:03 PM

## 2021-01-22 NOTE — Consult Note (Signed)
NEW PATIENT EVALUATION  Name: Ebony Steele  MRN: [id number]  Date:   01/22/2021     DOB: [birth year]   This 45 y.o. female patient presents to the clinic for initial evaluation of ER positive ductal carcinoma in situ of the right breast status post wide local excision.  Stage 0 (Tis N0 M0)  REFERRING PHYSICIAN: Bacigalupo, Dionne Bucy, MD  CHIEF COMPLAINT:  Chief Complaint  Patient presents with  . Breast Cancer    Initial consultation    DIAGNOSIS: The encounter diagnosis was Ductal carcinoma in situ (DCIS) of right breast.   PREVIOUS INVESTIGATIONS:  Mammogram and ultrasound reviewed Clinical notes reviewed Pathology report reviewed  HPI: Patient is a 45 year old female who presented with a palpable abnormality in her right axilla.  Bilateral diagnostic mammogramsWere performed with a palpable abnormality representing a resolving benign intradermal inclusion cyst.  There was noted indeterminate calcifications in the right breast warranting tissue diagnosis.  Calcifications spanned 0.3 x 1.6 x 0.4 cm.  She underwent targeted ultrasound positive for ER positive ductal carcinoma in situ low-grade.  She went on to have a wide local excision with a horizontal incision causing some retraction of her breast superiorly.  There was 5 mm of low-grade ductal carcinoma in situ ER positive.  She is now 2 weeks out from surgery is doing well 6 although she still somewhat tender in the right breast.  She otherwise specifically denies cough or bone pain.  She is seen today for radiation oncology consultation.  PLANNED TREATMENT REGIMEN: Right hypofractionated whole breast radiation  PAST MEDICAL HISTORY:  has a past medical history of Asthma, Breast cancer ([place]), BV (bacterial vaginosis), Family history of colon cancer, GERD (gastroesophageal reflux disease), Menometrorrhagia, and Thyroid disease.    PAST SURGICAL HISTORY:  Past Surgical History:  Procedure Laterality Date  . BREAST BIOPSY Right  12/19/2020   stereo bx/ x clip/ path pending  . DILATION AND CURETTAGE OF UTERUS  2005  . LAPAROSCOPY  2000  . OOPHORECTOMY Right 2000   Tertoma  . TUBAL LIGATION      FAMILY HISTORY: family history includes Colon cancer in her maternal aunt; Depression in her mother; Diabetes in her father and mother; Diabetes Mellitus I in her brother; Glaucoma in her mother; Hypertension in her mother; Kidney disease in her father.  SOCIAL HISTORY:  reports that she has quit smoking. Her smoking use included cigars. She has never used smokeless tobacco. She reports current alcohol use. She reports that she does not use drugs.  ALLERGIES: Acetaminophen  MEDICATIONS:  Current Outpatient Medications  Medication Sig Dispense Refill  . albuterol (VENTOLIN HFA) 108 (90 Base) MCG/ACT inhaler Inhale 1-2 puffs into the lungs every 6 (six) hours as needed for wheezing or shortness of breath.    . ASHWAGANDHA PO Take 1 tablet by mouth daily. Goli Gummies    . buPROPion (WELLBUTRIN XL) 300 MG 24 hr tablet Take 1 tablet (300 mg total) by mouth daily. 90 tablet 1  . ibuprofen (ADVIL) 200 MG tablet Take 200-400 mg by mouth every 6 (six) hours as needed for moderate pain.    Marland Kitchen levothyroxine (SYNTHROID) 88 MCG tablet Take 1 tablet (88 mcg total) by mouth daily. 90 tablet 3  . omeprazole (PRILOSEC) 40 MG capsule Take 1 capsule (40 mg total) by mouth daily. 30 capsule 3  . polyethylene glycol (MIRALAX / GLYCOLAX) 17 g packet Take 17 g by mouth daily as needed.    . traMADol (ULTRAM) 50 MG tablet  Take 1 tablet (50 mg total) by mouth every 6 (six) hours as needed. 20 tablet 0  . traZODone (DESYREL) 50 MG tablet Take 1-2 tablets (50-100 mg total) by mouth at bedtime as needed for sleep. (Patient taking differently: Take 50 mg by mouth at bedtime.) 60 tablet 3   No current facility-administered medications for this encounter.    ECOG PERFORMANCE STATUS:  0 - Asymptomatic  REVIEW OF SYSTEMS: Patient denies any weight  loss, fatigue, weakness, fever, chills or night sweats. Patient denies any loss of vision, blurred vision. Patient denies any ringing  of the ears or hearing loss. No irregular heartbeat. Patient denies heart murmur or history of fainting. Patient denies any chest pain or pain radiating to her upper extremities. Patient denies any shortness of breath, difficulty breathing at night, cough or hemoptysis. Patient denies any swelling in the lower legs. Patient denies any nausea vomiting, vomiting of blood, or coffee ground material in the vomitus. Patient denies any stomach pain. Patient states has had normal bowel movements no significant constipation or diarrhea. Patient denies any dysuria, hematuria or significant nocturia. Patient denies any problems walking, swelling in the joints or loss of balance. Patient denies any skin changes, loss of hair or loss of weight. Patient denies any excessive worrying or anxiety or significant depression. Patient denies any problems with insomnia. Patient denies excessive thirst, polyuria, polydipsia. Patient denies any swollen glands, patient denies easy bruising or easy bleeding. Patient denies any recent infections, allergies or URI. Patient "s visual fields have not changed significantly in recent time.   PHYSICAL EXAM: LMP 12/29/2020  She has a horizontal incision which has retracted the breast superiorly.  No dominant masses noted in either breast.  Incision is healing well no axillary or supraclavicular adenopathy is noted.  Well-developed well-nourished patient in NAD. HEENT reveals PERLA, EOMI, discs not visualized.  Oral cavity is clear. No oral mucosal lesions are identified. Neck is clear without evidence of cervical or supraclavicular adenopathy. Lungs are clear to A&P. Cardiac examination is essentially unremarkable with regular rate and rhythm without murmur rub or thrill. Abdomen is benign with no organomegaly or masses noted. Motor sensory and DTR levels are  equal and symmetric in the upper and lower extremities. Cranial nerves II through XII are grossly intact. Proprioception is intact. No peripheral adenopathy or edema is identified. No motor or sensory levels are noted. Crude visual fields are within normal range.  LABORATORY DATA: Pathology report reviewed    RADIOLOGY RESULTS: Mammogram ultrasound reviewed compatible with above-stated findings   IMPRESSION: Stage 0 ER positive ductal carcinoma in situ of the right breast status post wide local excision in 45 year old female  PLAN: At this time I have offered whole breast radiation.  Would plan on treating for 3 weeks to her whole breast boosting her scar another 1000 cGy using electron beam.  Risks and benefits of treatment including skin reaction fatigue alteration of blood counts possible inclusion of superficial lung all were described in detail to the patient.  She seems to comprehend my treatment plan well.  I have personally set up and ordered CT simulation for next week to allow for some further healing.  Patient also will be a candidate for antiestrogen therapy and that will be discussed with Dr. Janese Banks after completion of radiation.  I would like to take this opportunity to thank you for allowing me to participate in the care of your patient.Noreene Filbert, MD

## 2021-01-31 ENCOUNTER — Ambulatory Visit: Payer: Managed Care, Other (non HMO)

## 2021-01-31 DIAGNOSIS — Z170 Estrogen receptor positive status [ER+]: Secondary | ICD-10-CM | POA: Insufficient documentation

## 2021-01-31 DIAGNOSIS — D0511 Intraductal carcinoma in situ of right breast: Secondary | ICD-10-CM | POA: Insufficient documentation

## 2021-01-31 DIAGNOSIS — Z510 Encounter for antineoplastic radiation therapy: Secondary | ICD-10-CM | POA: Diagnosis not present

## 2021-02-01 ENCOUNTER — Other Ambulatory Visit: Payer: Self-pay | Admitting: *Deleted

## 2021-02-01 DIAGNOSIS — D0511 Intraductal carcinoma in situ of right breast: Secondary | ICD-10-CM

## 2021-02-02 DIAGNOSIS — Z510 Encounter for antineoplastic radiation therapy: Secondary | ICD-10-CM | POA: Diagnosis not present

## 2021-02-04 ENCOUNTER — Telehealth: Payer: Self-pay

## 2021-02-04 NOTE — Telephone Encounter (Signed)
Copied from [place] [id number]. Topic: Appointment Scheduling - Scheduling Inquiry for Clinic >> Feb 04, 2021 11:15 AM [name] wrote: Reason for CRM: Pt was recently diagnosed with breast cancer and would really like an appt with Dr. Brita Romp asa soon as she can/ Pt is scheduled for 8.4.22 which is the next open slot but wants to know if there is any way Dr. B can fit her in sooner / please advise

## 2021-02-07 ENCOUNTER — Ambulatory Visit: Admission: RE | Admit: 2021-02-07 | Payer: Managed Care, Other (non HMO) | Source: Ambulatory Visit

## 2021-02-07 DIAGNOSIS — Z510 Encounter for antineoplastic radiation therapy: Secondary | ICD-10-CM | POA: Diagnosis not present

## 2021-02-11 ENCOUNTER — Ambulatory Visit
Admission: RE | Admit: 2021-02-11 | Discharge: 2021-02-11 | Disposition: A | Discharge status: Home / Self Care | Payer: Managed Care, Other (non HMO) | Source: Ambulatory Visit | Attending: Radiation Oncology | Admitting: Radiation Oncology

## 2021-02-11 DIAGNOSIS — Z510 Encounter for antineoplastic radiation therapy: Secondary | ICD-10-CM | POA: Diagnosis not present

## 2021-02-12 ENCOUNTER — Ambulatory Visit
Admission: RE | Admit: 2021-02-12 | Discharge: 2021-02-12 | Disposition: A | Discharge status: Home / Self Care | Payer: Managed Care, Other (non HMO) | Source: Ambulatory Visit | Attending: Radiation Oncology | Admitting: Radiation Oncology

## 2021-02-12 DIAGNOSIS — Z510 Encounter for antineoplastic radiation therapy: Secondary | ICD-10-CM | POA: Diagnosis not present

## 2021-02-13 ENCOUNTER — Ambulatory Visit
Admission: RE | Admit: 2021-02-13 | Discharge: 2021-02-13 | Disposition: A | Discharge status: Home / Self Care | Payer: Managed Care, Other (non HMO) | Source: Ambulatory Visit | Attending: Radiation Oncology | Admitting: Radiation Oncology

## 2021-02-13 DIAGNOSIS — Z510 Encounter for antineoplastic radiation therapy: Secondary | ICD-10-CM | POA: Diagnosis not present

## 2021-02-14 ENCOUNTER — Ambulatory Visit
Admission: RE | Admit: 2021-02-14 | Discharge: 2021-02-14 | Disposition: A | Discharge status: Home / Self Care | Payer: Managed Care, Other (non HMO) | Source: Ambulatory Visit | Attending: Radiation Oncology | Admitting: Radiation Oncology

## 2021-02-14 DIAGNOSIS — Z510 Encounter for antineoplastic radiation therapy: Secondary | ICD-10-CM | POA: Diagnosis not present

## 2021-02-15 ENCOUNTER — Ambulatory Visit
Admission: RE | Admit: 2021-02-15 | Discharge: 2021-02-15 | Disposition: A | Discharge status: Home / Self Care | Payer: Managed Care, Other (non HMO) | Source: Ambulatory Visit | Attending: Radiation Oncology | Admitting: Radiation Oncology

## 2021-02-15 DIAGNOSIS — Z510 Encounter for antineoplastic radiation therapy: Secondary | ICD-10-CM | POA: Diagnosis not present

## 2021-02-18 ENCOUNTER — Ambulatory Visit
Admission: RE | Admit: 2021-02-18 | Discharge: 2021-02-18 | Disposition: A | Discharge status: Home / Self Care | Payer: Managed Care, Other (non HMO) | Source: Ambulatory Visit | Attending: Radiation Oncology | Admitting: Radiation Oncology

## 2021-02-18 DIAGNOSIS — Z510 Encounter for antineoplastic radiation therapy: Secondary | ICD-10-CM | POA: Diagnosis not present

## 2021-02-19 ENCOUNTER — Ambulatory Visit
Admission: RE | Admit: 2021-02-19 | Discharge: 2021-02-19 | Disposition: A | Discharge status: Home / Self Care | Payer: Managed Care, Other (non HMO) | Source: Ambulatory Visit | Attending: Radiation Oncology | Admitting: Radiation Oncology

## 2021-02-19 DIAGNOSIS — Z510 Encounter for antineoplastic radiation therapy: Secondary | ICD-10-CM | POA: Diagnosis not present

## 2021-02-20 ENCOUNTER — Ambulatory Visit
Admission: RE | Admit: 2021-02-20 | Discharge: 2021-02-20 | Disposition: A | Discharge status: Home / Self Care | Payer: Managed Care, Other (non HMO) | Source: Ambulatory Visit | Attending: Radiation Oncology | Admitting: Radiation Oncology

## 2021-02-20 ENCOUNTER — Ambulatory Visit: Admission: RE | Admit: 2021-02-20 | Payer: Managed Care, Other (non HMO) | Source: Ambulatory Visit

## 2021-02-20 DIAGNOSIS — Z510 Encounter for antineoplastic radiation therapy: Secondary | ICD-10-CM | POA: Diagnosis not present

## 2021-02-21 ENCOUNTER — Ambulatory Visit
Admission: RE | Admit: 2021-02-21 | Discharge: 2021-02-21 | Disposition: A | Discharge status: Home / Self Care | Payer: Managed Care, Other (non HMO) | Source: Ambulatory Visit | Attending: Radiation Oncology | Admitting: Radiation Oncology

## 2021-02-21 ENCOUNTER — Telehealth: Payer: Self-pay | Admitting: *Deleted

## 2021-02-21 DIAGNOSIS — Z510 Encounter for antineoplastic radiation therapy: Secondary | ICD-10-CM | POA: Diagnosis not present

## 2021-02-21 NOTE — Telephone Encounter (Signed)
Received a request from insurance about her disease and treatment plan. Filled out paper and sent in notes

## 2021-02-22 ENCOUNTER — Ambulatory Visit
Admission: RE | Admit: 2021-02-22 | Discharge: 2021-02-22 | Disposition: A | Discharge status: Home / Self Care | Payer: Managed Care, Other (non HMO) | Source: Ambulatory Visit | Attending: Radiation Oncology | Admitting: Radiation Oncology

## 2021-02-22 ENCOUNTER — Ambulatory Visit: Payer: Managed Care, Other (non HMO)

## 2021-02-22 DIAGNOSIS — Z510 Encounter for antineoplastic radiation therapy: Secondary | ICD-10-CM | POA: Insufficient documentation

## 2021-02-22 DIAGNOSIS — D0511 Intraductal carcinoma in situ of right breast: Secondary | ICD-10-CM | POA: Insufficient documentation

## 2021-02-23 DIAGNOSIS — Z510 Encounter for antineoplastic radiation therapy: Secondary | ICD-10-CM | POA: Diagnosis not present

## 2021-02-25 ENCOUNTER — Inpatient Hospital Stay: Payer: Managed Care, Other (non HMO) | Attending: Oncology

## 2021-02-25 ENCOUNTER — Ambulatory Visit
Admission: RE | Admit: 2021-02-25 | Discharge: 2021-02-25 | Disposition: A | Discharge status: Home / Self Care | Payer: Managed Care, Other (non HMO) | Source: Ambulatory Visit | Attending: Radiation Oncology | Admitting: Radiation Oncology

## 2021-02-25 ENCOUNTER — Other Ambulatory Visit (HOSPITAL_COMMUNITY)
Admission: RE | Admit: 2021-02-25 | Discharge: 2021-02-25 | Disposition: A | Discharge status: Home / Self Care | Payer: Managed Care, Other (non HMO) | Source: Ambulatory Visit | Attending: Family Medicine | Admitting: Family Medicine

## 2021-02-25 ENCOUNTER — Other Ambulatory Visit: Payer: Self-pay

## 2021-02-25 ENCOUNTER — Ambulatory Visit (INDEPENDENT_AMBULATORY_CARE_PROVIDER_SITE_OTHER): Payer: Managed Care, Other (non HMO) | Admitting: Family Medicine

## 2021-02-25 ENCOUNTER — Encounter: Payer: Self-pay | Admitting: Family Medicine

## 2021-02-25 VITALS — BP 115/85 | HR 109 | Temp 98.5°F | Resp 16 | Ht 64.0 in | Wt 138.8 lb

## 2021-02-25 DIAGNOSIS — Z510 Encounter for antineoplastic radiation therapy: Secondary | ICD-10-CM | POA: Diagnosis not present

## 2021-02-25 DIAGNOSIS — F331 Major depressive disorder, recurrent, moderate: Secondary | ICD-10-CM

## 2021-02-25 DIAGNOSIS — Z800 Family history of malignant neoplasm of digestive organs: Secondary | ICD-10-CM | POA: Diagnosis not present

## 2021-02-25 DIAGNOSIS — Z1211 Encounter for screening for malignant neoplasm of colon: Secondary | ICD-10-CM

## 2021-02-25 DIAGNOSIS — D0511 Intraductal carcinoma in situ of right breast: Secondary | ICD-10-CM

## 2021-02-25 DIAGNOSIS — F411 Generalized anxiety disorder: Secondary | ICD-10-CM

## 2021-02-25 DIAGNOSIS — Z124 Encounter for screening for malignant neoplasm of cervix: Secondary | ICD-10-CM

## 2021-02-25 DIAGNOSIS — E039 Hypothyroidism, unspecified: Secondary | ICD-10-CM

## 2021-02-25 LAB — CBC
HCT: 37.2 % (ref 36.0–46.0)
Hemoglobin: 12.3 g/dL (ref 12.0–15.0)
MCH: 29.3 pg (ref 26.0–34.0)
MCHC: 33.1 g/dL (ref 30.0–36.0)
MCV: 88.6 fL (ref 80.0–100.0)
Platelets: 216 10*3/uL (ref 150–400)
RBC: 4.2 MIL/uL (ref 3.87–5.11)
RDW: 13.5 % (ref 11.5–15.5)
WBC: 4.5 10*3/uL (ref 4.0–10.5)
nRBC: 0 % (ref 0.0–0.2)

## 2021-02-25 NOTE — Assessment & Plan Note (Signed)
Previously well controlled Continue Synthroid at current dose  Recheck TSH and adjust Synthroid as indicated   

## 2021-02-25 NOTE — Assessment & Plan Note (Signed)
Chronic and stable Continue wellbutrin at 300mg  daily Encourage therapy

## 2021-02-25 NOTE — Progress Notes (Signed)
Established patient visit   Patient: Ebony Steele   DOB: [birth year]   45 y.o. Female  MRN: [id number] Visit Date: 02/25/2021  Today's healthcare provider: Lavon Paganini, MD   Chief Complaint  Patient presents with   Follow-up   Subjective    HPI  Follow up for  Depression/Anxiety and breast cancer    The patient was last seen for this 5 months ago. Changes made at last visit include increased Wellbutrin to 300mg  daily.  She reports good compliance with treatment. She feels that condition is Unchanged. She is not having side effects.   Followed by Onc - concerned about tamoxifen     Medications: Outpatient Medications Prior to Visit  Medication Sig   albuterol (VENTOLIN HFA) 108 (90 Base) MCG/ACT inhaler Inhale 1-2 puffs into the lungs every 6 (six) hours as needed for wheezing or shortness of breath.   ASHWAGANDHA PO Take 1 tablet by mouth daily. Goli Gummies   buPROPion (WELLBUTRIN XL) 300 MG 24 hr tablet Take 1 tablet (300 mg total) by mouth daily.   ibuprofen (ADVIL) 200 MG tablet Take 200-400 mg by mouth every 6 (six) hours as needed for moderate pain.   levothyroxine (SYNTHROID) 88 MCG tablet Take 1 tablet (88 mcg total) by mouth daily.   omeprazole (PRILOSEC) 40 MG capsule Take 1 capsule (40 mg total) by mouth daily.   polyethylene glycol (MIRALAX / GLYCOLAX) 17 g packet Take 17 g by mouth daily as needed.   traMADol (ULTRAM) 50 MG tablet Take 1 tablet (50 mg total) by mouth every 6 (six) hours as needed.   traZODone (DESYREL) 50 MG tablet Take 1-2 tablets (50-100 mg total) by mouth at bedtime as needed for sleep. (Patient taking differently: Take 50 mg by mouth at bedtime.)   No facility-administered medications prior to visit.    Review of Systems  Constitutional:  Positive for activity change, appetite change and fatigue. Negative for chills and fever.  HENT:  Negative for congestion, ear pain, rhinorrhea, sinus pain and sore throat.    Respiratory:  Negative for cough, shortness of breath and wheezing.   Cardiovascular:  Negative for chest pain, palpitations and leg swelling.  Gastrointestinal:  Negative for abdominal pain, blood in stool, diarrhea, nausea and vomiting.  Endocrine: Negative for cold intolerance and heat intolerance.  Genitourinary:  Negative for dysuria, flank pain, frequency and urgency.  Musculoskeletal:  Negative for arthralgias.  Neurological:  Negative for dizziness, light-headedness and headaches.      Objective    BP 115/85   Pulse (!) 109   Temp 98.5 F (36.9 C)   Resp 16   Ht 5\' 4"  (1.626 m)   Wt 138 lb 12.8 oz (63 kg)   BMI 23.82 kg/m     Physical Exam Vitals reviewed.  Constitutional:      General: She is not in acute distress.    Appearance: Normal appearance. She is well-developed. She is not diaphoretic.  HENT:     Head: Normocephalic and atraumatic.  Eyes:     General: No scleral icterus.    Conjunctiva/sclera: Conjunctivae normal.  Neck:     Thyroid: No thyromegaly.  Cardiovascular:     Rate and Rhythm: Normal rate and regular rhythm.     Pulses: Normal pulses.     Heart sounds: Normal heart sounds. No murmur heard. Pulmonary:     Effort: Pulmonary effort is normal. No respiratory distress.     Breath sounds: Normal breath sounds.  No wheezing, rhonchi or rales.  Genitourinary:    Comments: GYN:  External genitalia within normal limits.  Vaginal mucosa pink, moist, normal rugae.  Nonfriable cervix without lesions, no discharge or bleeding noted on speculum exam.  Bimanual exam revealed normal, nongravid uterus.  No cervical motion tenderness. No adnexal masses bilaterally.    Musculoskeletal:     Cervical back: Neck supple.     Right lower leg: No edema.     Left lower leg: No edema.  Lymphadenopathy:     Cervical: No cervical adenopathy.  Skin:    General: Skin is warm and dry.     Findings: No rash.  Neurological:     Mental Status: She is alert and oriented  to person, place, and time. Mental status is at baseline.  Psychiatric:        Mood and Affect: Mood normal.        Behavior: Behavior normal.      No results found for any visits on 02/25/21.  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      Frederic Jericho Moore[place],acting as a Education administrator for Lavon Paganini, MD.,have documented all relevant documentation on the behalf of Lavon Paganini, MD,as directed by  Lavon Paganini, MD while in the presence of Lavon Paganini, MD.  I, Lavon Paganini, MD, have reviewed all documentation for this visit. The documentation on 02/25/21 for the exam, diagnosis, procedures, and orders are all accurate and complete.   Naoma Boxell, Dionne Bucy, MD, MPH [facility]

## 2021-02-25 NOTE — Assessment & Plan Note (Signed)
Chronic and stable Continue wellbutrin Encourage therapy

## 2021-02-25 NOTE — Assessment & Plan Note (Signed)
Followed by med and rad Onc Discussed tamoxifen again, but she does not think benefits outweigh risks She will feel better about it knowing there are no other cancers - pap and colon cancer screening ordered today

## 2021-02-26 ENCOUNTER — Ambulatory Visit
Admission: RE | Admit: 2021-02-26 | Discharge: 2021-02-26 | Disposition: A | Discharge status: Home / Self Care | Payer: Managed Care, Other (non HMO) | Source: Ambulatory Visit | Attending: Radiation Oncology | Admitting: Radiation Oncology

## 2021-02-26 DIAGNOSIS — Z510 Encounter for antineoplastic radiation therapy: Secondary | ICD-10-CM | POA: Diagnosis not present

## 2021-02-26 LAB — TSH: TSH: 2.57 u[IU]/mL (ref 0.450–4.500)

## 2021-02-27 ENCOUNTER — Ambulatory Visit
Admission: RE | Admit: 2021-02-27 | Discharge: 2021-02-27 | Disposition: A | Discharge status: Home / Self Care | Payer: Managed Care, Other (non HMO) | Source: Ambulatory Visit | Attending: Radiation Oncology | Admitting: Radiation Oncology

## 2021-02-27 DIAGNOSIS — Z510 Encounter for antineoplastic radiation therapy: Secondary | ICD-10-CM | POA: Diagnosis not present

## 2021-02-27 LAB — CYTOLOGY - PAP
Comment: NEGATIVE
Diagnosis: NEGATIVE
High risk HPV: NEGATIVE

## 2021-02-28 ENCOUNTER — Ambulatory Visit
Admission: RE | Admit: 2021-02-28 | Discharge: 2021-02-28 | Disposition: A | Discharge status: Home / Self Care | Payer: Managed Care, Other (non HMO) | Source: Ambulatory Visit | Attending: Radiation Oncology | Admitting: Radiation Oncology

## 2021-02-28 DIAGNOSIS — D0511 Intraductal carcinoma in situ of right breast: Secondary | ICD-10-CM | POA: Diagnosis present

## 2021-02-28 DIAGNOSIS — Z510 Encounter for antineoplastic radiation therapy: Secondary | ICD-10-CM | POA: Diagnosis present

## 2021-03-01 ENCOUNTER — Ambulatory Visit
Admission: RE | Admit: 2021-03-01 | Discharge: 2021-03-01 | Disposition: A | Discharge status: Home / Self Care | Payer: 59 | Source: Ambulatory Visit | Attending: Radiation Oncology | Admitting: Radiation Oncology

## 2021-03-01 DIAGNOSIS — Z510 Encounter for antineoplastic radiation therapy: Secondary | ICD-10-CM | POA: Diagnosis not present

## 2021-03-01 DIAGNOSIS — D0511 Intraductal carcinoma in situ of right breast: Secondary | ICD-10-CM | POA: Diagnosis not present

## 2021-03-05 ENCOUNTER — Ambulatory Visit
Admission: RE | Admit: 2021-03-05 | Discharge: 2021-03-05 | Disposition: A | Discharge status: Home / Self Care | Payer: 59 | Source: Ambulatory Visit | Attending: Radiation Oncology | Admitting: Radiation Oncology

## 2021-03-05 DIAGNOSIS — Z510 Encounter for antineoplastic radiation therapy: Secondary | ICD-10-CM | POA: Diagnosis not present

## 2021-03-06 ENCOUNTER — Ambulatory Visit
Admission: RE | Admit: 2021-03-06 | Discharge: 2021-03-06 | Disposition: A | Discharge status: Home / Self Care | Payer: 59 | Source: Ambulatory Visit | Attending: Radiation Oncology | Admitting: Radiation Oncology

## 2021-03-06 ENCOUNTER — Telehealth: Payer: Self-pay

## 2021-03-06 DIAGNOSIS — Z510 Encounter for antineoplastic radiation therapy: Secondary | ICD-10-CM | POA: Diagnosis not present

## 2021-03-06 NOTE — Telephone Encounter (Signed)
Received another request from Case Manager Inda Merlin from [facility]. Re-filled out paperwork and have faxed it back to [phone] and fax went through.

## 2021-03-07 ENCOUNTER — Ambulatory Visit
Admission: RE | Admit: 2021-03-07 | Discharge: 2021-03-07 | Disposition: A | Discharge status: Home / Self Care | Payer: 59 | Source: Ambulatory Visit | Attending: Radiation Oncology | Admitting: Radiation Oncology

## 2021-03-07 DIAGNOSIS — Z510 Encounter for antineoplastic radiation therapy: Secondary | ICD-10-CM | POA: Diagnosis not present

## 2021-03-08 ENCOUNTER — Encounter: Payer: Self-pay | Admitting: *Deleted

## 2021-03-08 ENCOUNTER — Ambulatory Visit
Admission: RE | Admit: 2021-03-08 | Discharge: 2021-03-08 | Disposition: A | Discharge status: Home / Self Care | Payer: 59 | Source: Ambulatory Visit | Attending: Radiation Oncology | Admitting: Radiation Oncology

## 2021-03-08 DIAGNOSIS — Z510 Encounter for antineoplastic radiation therapy: Secondary | ICD-10-CM | POA: Diagnosis not present

## 2021-03-11 ENCOUNTER — Ambulatory Visit
Admission: RE | Admit: 2021-03-11 | Discharge: 2021-03-11 | Disposition: A | Discharge status: Home / Self Care | Payer: 59 | Source: Ambulatory Visit | Attending: Radiation Oncology | Admitting: Radiation Oncology

## 2021-03-11 DIAGNOSIS — Z510 Encounter for antineoplastic radiation therapy: Secondary | ICD-10-CM | POA: Diagnosis not present

## 2021-03-12 ENCOUNTER — Ambulatory Visit
Admission: RE | Admit: 2021-03-12 | Discharge: 2021-03-12 | Disposition: A | Discharge status: Home / Self Care | Payer: 59 | Source: Ambulatory Visit | Attending: Radiation Oncology | Admitting: Radiation Oncology

## 2021-03-12 DIAGNOSIS — Z510 Encounter for antineoplastic radiation therapy: Secondary | ICD-10-CM | POA: Diagnosis not present

## 2021-03-25 ENCOUNTER — Telehealth: Payer: Self-pay

## 2021-04-02 ENCOUNTER — Encounter: Payer: Self-pay | Admitting: *Deleted

## 2021-04-04 ENCOUNTER — Ambulatory Visit: Payer: 59 | Admitting: Family Medicine

## 2021-04-09 ENCOUNTER — Encounter: Payer: Self-pay | Admitting: *Deleted

## 2021-04-22 ENCOUNTER — Ambulatory Visit
Admission: RE | Admit: 2021-04-22 | Discharge: 2021-04-22 | Disposition: A | Discharge status: Home / Self Care | Payer: 59 | Source: Ambulatory Visit | Attending: Radiation Oncology | Admitting: Radiation Oncology

## 2021-04-22 ENCOUNTER — Encounter: Payer: Self-pay | Admitting: Radiation Oncology

## 2021-04-22 ENCOUNTER — Other Ambulatory Visit: Payer: Self-pay

## 2021-04-22 VITALS — BP 138/90 | HR 92 | Temp 98.4°F | Wt 138.9 lb

## 2021-04-22 DIAGNOSIS — Z170 Estrogen receptor positive status [ER+]: Secondary | ICD-10-CM

## 2021-04-22 NOTE — Progress Notes (Signed)
Radiation Oncology Follow up Note  Name: Ebony Steele   Date:   04/22/2021 MRN:  [id number] DOB: [birth year]    This 45 y.o. female presents to the clinic today for 1 month follow-up status post whole breast radiation to her right breast for ductal carcinoma in situ ER positive.  REFERRING PROVIDER: Virginia Crews, MD  HPI: Patient is a 45 year old female now at 1 month having completed whole breast radiation to her right breast for ER positive ductal carcinoma in situ seen today in routine follow-up she is doing well.  She specifically denies breast tenderness cough or bone pain.  She has been offered antiestrogen therapy although is declining that..  COMPLICATIONS OF TREATMENT: none  FOLLOW UP COMPLIANCE: keeps appointments   PHYSICAL EXAM:  BP 138/90   Pulse 92   Temp 98.4 F (36.9 C) (Tympanic)   Wt 138 lb 14.4 oz (63 kg)   BMI 23.84 kg/m  Lungs are clear to A&P cardiac examination essentially unremarkable with regular rate and rhythm. No dominant mass or nodularity is noted in either breast in 2 positions examined. Incision is well-healed. No axillary or supraclavicular adenopathy is appreciated. Cosmetic result is excellent.  Still some slight hyperpigmentation of the skin which I assured her will resolve.  Well-developed well-nourished patient in NAD. HEENT reveals PERLA, EOMI, discs not visualized.  Oral cavity is clear. No oral mucosal lesions are identified. Neck is clear without evidence of cervical or supraclavicular adenopathy. Lungs are clear to A&P. Cardiac examination is essentially unremarkable with regular rate and rhythm without murmur rub or thrill. Abdomen is benign with no organomegaly or masses noted. Motor sensory and DTR levels are equal and symmetric in the upper and lower extremities. Cranial nerves II through XII are grossly intact. Proprioception is intact. No peripheral adenopathy or edema is identified. No motor or sensory levels are noted. Crude visual  fields are within normal range.  RADIOLOGY RESULTS: No current films for review  PLAN: At the present time patient is doing well 1 month out from whole breast radiation and pleased with her overall progress.  I have asked to see her back in 4 to 5 months for follow-up.  Patient knows to call with any concerns.  I would like to take this opportunity to thank you for allowing me to participate in the care of your patient.Noreene Filbert, MD

## 2021-04-26 ENCOUNTER — Telehealth: Payer: Managed Care, Other (non HMO) | Admitting: Oncology

## 2021-04-30 ENCOUNTER — Inpatient Hospital Stay: Payer: 59 | Attending: Oncology | Admitting: Oncology

## 2021-04-30 DIAGNOSIS — D0511 Intraductal carcinoma in situ of right breast: Secondary | ICD-10-CM

## 2021-05-05 NOTE — Progress Notes (Signed)
I connected with Ebony Steele on 05/05/21 at  2:30 PM EDT by video enabled telemedicine visit and verified that I am speaking with the correct person using two identifiers.   I discussed the limitations, risks, security and privacy concerns of performing an evaluation and management service by telemedicine and the availability of in-person appointments. I also discussed with the patient that there may be a patient responsible charge related to this service. The patient expressed understanding and agreed to proceed.  Other persons participating in the visit and their role in the encounter:  Patients husband  Patient's location:  home Provider's location:  work  Risk analyst Complaint: Follow-up of right breast DCIS  History of present illness: Patient is a 45 year old premenopausal African-American female found to have abnormal mammogram in May 2022.  This was biopsy-proven low-grade DCIS ER positive.  She underwent lumpectomy with Dr. Peyton Najjar in June 2022.  Final pathology showed 5 mm grade 1 low-grade DCIS with negative margins.  She underwent adjuvant radiation treatment.  She has decided not to pursue any adjuvant hormone therapy.  Interval history patient reports healing well from her radiation treatment although she has some radiation-induced dermatitis for which she is using topical cream.  Denies other complaints at this time.   Review of Systems  Constitutional:  Negative for chills, fever, malaise/fatigue and weight loss.  HENT:  Negative for congestion, ear discharge and nosebleeds.   Eyes:  Negative for blurred vision.  Respiratory:  Negative for cough, hemoptysis, sputum production, shortness of breath and wheezing.   Cardiovascular:  Negative for chest pain, palpitations, orthopnea and claudication.  Gastrointestinal:  Negative for abdominal pain, blood in stool, constipation, diarrhea, heartburn, melena, nausea and vomiting.  Genitourinary:  Negative for dysuria, flank pain, frequency,  hematuria and urgency.  Musculoskeletal:  Negative for back pain, joint pain and myalgias.  Skin:  Negative for rash.  Neurological:  Negative for dizziness, tingling, focal weakness, seizures, weakness and headaches.  Endo/Heme/Allergies:  Does not bruise/bleed easily.  Psychiatric/Behavioral:  Negative for depression and suicidal ideas. The patient does not have insomnia.    Allergies  Allergen Reactions   Acetaminophen Anaphylaxis    Past Medical History:  Diagnosis Date   Asthma    Breast cancer (HCC)    BV (bacterial vaginosis)    Family history of colon cancer    GERD (gastroesophageal reflux disease)    Menometrorrhagia    Thyroid disease     Past Surgical History:  Procedure Laterality Date   BREAST BIOPSY Right 12/19/2020   stereo bx/ x clip/ path pending   DILATION AND CURETTAGE OF UTERUS  2005   LAPAROSCOPY  2000   OOPHORECTOMY Right 2000   [place]   TUBAL LIGATION      Social History   Socioeconomic History   Marital status: Married    Spouse name: Not on file   Number of children: 3   Years of education: College   Highest education level: Not on file  Occupational History   Occupation: [employer]    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  She stopped  the end of May  2018   Sexual activity: Yes  Other Topics Concern   Not on file  Social History Narrative   Not on file   Social Determinants of Health   Financial Resource Strain: Not on file  Food Insecurity: Not on file  Transportation Needs: Not on file  Physical Activity: Not on file  Stress: Not on file  Social Connections: Not on file  Intimate Partner Violence: Not on file     Family History  Problem Relation Age of Onset   Diabetes Mother    Depression Mother    Hypertension Mother    Glaucoma Mother    Kidney disease Father    Diabetes Father    Diabetes Mellitus I Brother    Colon cancer Maternal Aunt      Current Outpatient Medications:    albuterol (VENTOLIN HFA) 108 (90 Base) MCG/ACT inhaler, Inhale 1-2 puffs into the lungs every 6 (six) hours as needed for wheezing or shortness of breath., Disp: , Rfl:    ASHWAGANDHA PO, Take 1 tablet by mouth daily. Goli Gummies, Disp: , Rfl:    buPROPion (WELLBUTRIN XL) 300 MG 24 hr tablet, Take 1 tablet (300 mg total) by mouth daily., Disp: 90 tablet, Rfl: 1   ibuprofen (ADVIL) 200 MG tablet, Take 200-400 mg by mouth every 6 (six) hours as needed for moderate pain., Disp: , Rfl:    levothyroxine (SYNTHROID) 88 MCG tablet, Take 1 tablet (88 mcg total) by mouth daily., Disp: 90 tablet, Rfl: 3   omeprazole (PRILOSEC) 40 MG capsule, Take 1 capsule (40 mg total) by mouth daily., Disp: 30 capsule, Rfl: 3   polyethylene glycol (MIRALAX / GLYCOLAX) 17 g packet, Take 17 g by mouth daily as needed., Disp: , Rfl:    traZODone (DESYREL) 50 MG tablet, Take 1-2 tablets (50-100 mg total) by mouth at bedtime as needed for sleep. (Patient taking differently: Take 50 mg by mouth at bedtime.), Disp: 60 tablet, Rfl: 3   traMADol (ULTRAM) 50 MG tablet, Take 1 tablet (50 mg total) by mouth every 6 (six) hours as needed., Disp: 20 tablet, Rfl: 0  No results found.  No images are attached to the encounter.   CMP Latest Ref Rng & Units 09/10/2020  Glucose 65 - 99 mg/dL 84  BUN 6 - 24 mg/dL 10  Creatinine 0.57 - 1.00 mg/dL 0.88  Sodium 134 - 144 mmol/L 139  Potassium 3.5 - 5.2 mmol/L 4.1  Chloride 96 - 106 mmol/L 107(H)  CO2 20 - 29 mmol/L 20  Calcium 8.7 - 10.2 mg/dL 9.2  Total Protein 6.0 - 8.5 g/dL 7.3  Total Bilirubin 0.0 - 1.2 mg/dL 0.3  Alkaline Phos 44 - 121 IU/L 75  AST 0 - 40 IU/L 16  ALT 0 - 32 IU/L 14   CBC  Latest Ref Rng & Units 02/25/2021  WBC 4.0 - 10.5 K/uL 4.5  Hemoglobin 12.0 - 15.0 g/dL 12.3  Hematocrit 36.0 - 46.0 % 37.2  Platelets 150 - 400 K/uL 216     Observation/objective: Appears in no acute distress over video visit today.  Breathing is nonlabored  Assessment and plan: Patient is a 45 year old female with right breast DCIS ER positive and this is a routine follow-up visit  Patient is doing well from a DCIS standpoint and has completed lumpectomy and adjuvant radiation therapy.  She has ER positive but does not wish to proceed with any hormone therapy at this time.  She will therefore require yearly breast exams and mammograms  for routine screening.  No evidence for any routine blood work or other surveillance scans at this time.  I will see her back in 6 months no labs  Follow-up instructions:as above  I discussed the assessment and treatment plan with the patient. The patient was provided an opportunity to ask questions and all were answered. The patient agreed with the plan and demonstrated an understanding of the instructions.   The patient was advised to call back or seek an in-person evaluation if the symptoms worsen or if the condition fails to improve as anticipated.    Visit Diagnosis: 1. Neoplasm of right breast, primary tumor staging category Tis: ductal carcinoma in situ (DCIS)     Dr. Randa Evens, MD, MPH [facility] Tel- [phone] 05/05/2021 10:11 AM

## 2021-06-20 ENCOUNTER — Other Ambulatory Visit: Payer: Self-pay | Admitting: Family Medicine

## 2021-06-20 MED ORDER — ALBUTEROL SULFATE HFA 108 (90 BASE) MCG/ACT IN AERS: 1.0000 | INHALATION_SPRAY | Freq: Four times a day (QID) | RESPIRATORY_TRACT | 2 refills | Status: AC | PRN

## 2021-06-21 ENCOUNTER — Other Ambulatory Visit: Payer: Self-pay | Admitting: Family Medicine

## 2021-06-22 NOTE — Telephone Encounter (Signed)
Requested Prescriptions  Pending Prescriptions Disp Refills  . buPROPion (WELLBUTRIN XL) 300 MG 24 hr tablet [Pharmacy Med Name: buPROPion HCl ER (XL) 300 MG Oral Tablet Extended Release 24 Hour] 90 tablet 0    Sig: Take 1 tablet by mouth once daily     Psychiatry: Antidepressants - bupropion Failed - 06/21/2021  5:57 PM      Failed - Last BP in normal range    BP Readings from Last 1 Encounters:  04/22/21 138/90         Passed - Completed PHQ-2 or PHQ-9 in the last 360 days      Passed - Valid encounter within last 6 months    Recent Outpatient Visits          3 months ago Neoplasm of right breast, primary tumor staging category Tis: ductal carcinoma in situ (DCIS)   [facility] [place], Dionne Bucy, MD   8 months ago Moderate episode of recurrent major depressive disorder [facility])   [place], [place], PA-C   9 months ago GAD (generalized anxiety disorder)   [facility] [place], Dionne Bucy, MD   9 months ago Encounter for annual physical exam   [facility], Dionne Bucy, MD   1 year ago Epigastric abdominal pain   [facility] [place], Dionne Bucy, MD      Future Appointments            In 3 months [place], Dionne Bucy, MD [facility] [place]

## 2021-09-24 ENCOUNTER — Ambulatory Visit: Payer: Self-pay | Admitting: Family Medicine

## 2021-09-25 ENCOUNTER — Ambulatory Visit: Payer: 59 | Admitting: Radiation Oncology

## 2021-10-29 ENCOUNTER — Other Ambulatory Visit: Payer: Self-pay

## 2021-10-29 ENCOUNTER — Ambulatory Visit
Admission: RE | Admit: 2021-10-29 | Discharge: 2021-10-29 | Disposition: A | Discharge status: Home / Self Care | Payer: Self-pay | Source: Ambulatory Visit | Attending: Radiation Oncology | Admitting: Radiation Oncology

## 2021-10-29 ENCOUNTER — Inpatient Hospital Stay: Payer: Self-pay | Attending: Oncology | Admitting: Oncology

## 2021-10-29 ENCOUNTER — Encounter: Payer: Self-pay | Admitting: Oncology

## 2021-10-29 VITALS — BP 141/100 | HR 80 | Temp 97.3°F | Resp 16 | Ht 64.0 in | Wt 136.8 lb

## 2021-10-29 DIAGNOSIS — Z923 Personal history of irradiation: Secondary | ICD-10-CM | POA: Insufficient documentation

## 2021-10-29 DIAGNOSIS — K219 Gastro-esophageal reflux disease without esophagitis: Secondary | ICD-10-CM | POA: Insufficient documentation

## 2021-10-29 DIAGNOSIS — Z86000 Personal history of in-situ neoplasm of breast: Secondary | ICD-10-CM

## 2021-10-29 DIAGNOSIS — D0511 Intraductal carcinoma in situ of right breast: Secondary | ICD-10-CM | POA: Insufficient documentation

## 2021-10-29 DIAGNOSIS — Z08 Encounter for follow-up examination after completed treatment for malignant neoplasm: Secondary | ICD-10-CM

## 2021-10-29 DIAGNOSIS — J45909 Unspecified asthma, uncomplicated: Secondary | ICD-10-CM | POA: Insufficient documentation

## 2021-10-29 DIAGNOSIS — Z170 Estrogen receptor positive status [ER+]: Secondary | ICD-10-CM | POA: Insufficient documentation

## 2021-10-29 DIAGNOSIS — Z79899 Other long term (current) drug therapy: Secondary | ICD-10-CM | POA: Insufficient documentation

## 2021-10-29 DIAGNOSIS — E079 Disorder of thyroid, unspecified: Secondary | ICD-10-CM | POA: Insufficient documentation

## 2021-10-29 DIAGNOSIS — Z800 Family history of malignant neoplasm of digestive organs: Secondary | ICD-10-CM | POA: Insufficient documentation

## 2021-10-29 DIAGNOSIS — N920 Excessive and frequent menstruation with regular cycle: Secondary | ICD-10-CM | POA: Insufficient documentation

## 2021-10-29 NOTE — Progress Notes (Signed)
Hematology/Oncology Consult note [facility], [place]  Telephone:([phone] [phone]  Patient Care Team: Virginia Crews, MD as PCP - General (Family Medicine) Sindy Guadeloupe, MD as Consulting Physician (Oncology) Herbert Pun, MD as Consulting Physician (General Surgery) Noreene Filbert, MD as Consulting Physician (Radiation Oncology)   Name of the patient: Ebony Steele  [id number]  1976/06/27   Date of visit: 10/29/21  Diagnosis-right breast DCIS  Chief complaint/ Reason for visit-routine follow-up of right breast DCIS  Heme/Onc history:  Patient is a 46 year old premenopausal African-American female found to have abnormal mammogram in May 2022.  This was biopsy-proven low-grade DCIS ER positive.  She underwent lumpectomy with Dr. Peyton Najjar in June 2022.  Final pathology showed 5 mm grade 1 low-grade DCIS with negative margins.  She underwent adjuvant radiation treatment.  She has decided not to pursue any adjuvant hormone therapy.  Interval history-denies any breast concerns presently.  She has ongoing stressors about her mom's health who is currently hospitalized.  Reports occasional pain in her right breast0  ECOG PS- 0 Pain scale- 0   Review of systems- Review of Systems  Constitutional:  Negative for chills, fever, malaise/fatigue and weight loss.  HENT:  Negative for congestion, ear discharge and nosebleeds.   Eyes:  Negative for blurred vision.  Respiratory:  Negative for cough, hemoptysis, sputum production, shortness of breath and wheezing.   Cardiovascular:  Negative for chest pain, palpitations, orthopnea and claudication.  Gastrointestinal:  Negative for abdominal pain, blood in stool, constipation, diarrhea, heartburn, melena, nausea and vomiting.  Genitourinary:  Negative for dysuria, flank pain, frequency, hematuria and urgency.  Musculoskeletal:  Negative for back pain, joint pain and myalgias.  Skin:  Negative for  rash.  Neurological:  Negative for dizziness, tingling, focal weakness, seizures, weakness and headaches.  Endo/Heme/Allergies:  Does not bruise/bleed easily.  Psychiatric/Behavioral:  Negative for depression and suicidal ideas. The patient does not have insomnia.      Allergies  Allergen Reactions   Acetaminophen Anaphylaxis     Past Medical History:  Diagnosis Date   Asthma    Breast cancer (HCC)    BV (bacterial vaginosis)    Family history of colon cancer    GERD (gastroesophageal reflux disease)    Menometrorrhagia    Thyroid disease      Past Surgical History:  Procedure Laterality Date   BREAST BIOPSY Right 12/19/2020   stereo bx/ x clip/ path pending   DILATION AND CURETTAGE OF UTERUS  2005   LAPAROSCOPY  2000   OOPHORECTOMY Right 2000   [place]   TUBAL LIGATION      Social History   Socioeconomic History   Marital status: Married    Spouse name: Not on file   Number of children: 3   Years of education: College   Highest education level: Not on file  Occupational History   Occupation: [employer]    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  She stopped  the end of May 2018   Sexual activity: Yes  Other Topics Concern   Not on file  Social History Narrative  Not on file   Social Determinants of Health   Financial Resource Strain: Not on file  Food Insecurity: Not on file  Transportation Needs: Not on file  Physical Activity: Not on file  Stress: Not on file  Social Connections: Not on file  Intimate Partner Violence: Not on file    Family History  Problem Relation Age of Onset   Diabetes Mother    Depression Mother    Hypertension Mother     Glaucoma Mother    Kidney disease Father    Diabetes Father    Diabetes Mellitus I Brother    Colon cancer Maternal Aunt      Current Outpatient Medications:    albuterol (VENTOLIN HFA) 108 (90 Base) MCG/ACT inhaler, Inhale 1-2 puffs into the lungs every 6 (six) hours as needed for wheezing or shortness of breath., Disp: 1 each, Rfl: 2   ASHWAGANDHA PO, Take 1 tablet by mouth daily. Goli Gummies, Disp: , Rfl:    buPROPion (WELLBUTRIN XL) 300 MG 24 hr tablet, Take 1 tablet by mouth once daily, Disp: 90 tablet, Rfl: 0   ibuprofen (ADVIL) 200 MG tablet, Take 200-400 mg by mouth every 6 (six) hours as needed for moderate pain., Disp: , Rfl:    levothyroxine (SYNTHROID) 88 MCG tablet, Take 1 tablet (88 mcg total) by mouth daily., Disp: 90 tablet, Rfl: 3   omeprazole (PRILOSEC) 40 MG capsule, Take 1 capsule (40 mg total) by mouth daily., Disp: 30 capsule, Rfl: 3   polyethylene glycol (MIRALAX / GLYCOLAX) 17 g packet, Take 17 g by mouth daily as needed., Disp: , Rfl:    traMADol (ULTRAM) 50 MG tablet, Take 1 tablet (50 mg total) by mouth every 6 (six) hours as needed., Disp: 20 tablet, Rfl: 0   traZODone (DESYREL) 50 MG tablet, Take 1-2 tablets (50-100 mg total) by mouth at bedtime as needed for sleep. (Patient taking differently: Take 50 mg by mouth at bedtime.), Disp: 60 tablet, Rfl: 3  Physical exam:  Vitals:   10/29/21 1024  BP: (!) 141/100  Pulse: 80  Resp: 16  Temp: (!) 97.3 F (36.3 C)  TempSrc: Tympanic  SpO2: 100%  Weight: 136 lb 12.8 oz (62.1 kg)  Height: 5\' 4"  (1.626 m)   Physical Exam HENT:     Head: Normocephalic and atraumatic.  Eyes:     Pupils: Pupils are equal, round, and reactive to light.  Cardiovascular:     Rate and Rhythm: Normal rate and regular rhythm.     Heart sounds: Normal heart sounds.  Pulmonary:     Effort: Pulmonary effort is normal.     Breath sounds: Normal breath sounds.  Abdominal:     General: Bowel sounds are normal.     Palpations:  Abdomen is soft.  Musculoskeletal:     Cervical back: Normal range of motion.  Skin:    General: Skin is warm and dry.  Neurological:     Mental Status: She is alert and oriented to person, place, and time.   Breast exam was performed in seated and lying down position. Patient is status post right lumpectomy with a well-healed surgical scar. No evidence of any palpable masses. No evidence of axillary adenopathy. No evidence of any palpable masses or lumps in the left breast. No evidence of leftt axillary adenopathy   CMP Latest Ref Rng & Units 09/10/2020  Glucose 65 - 99 mg/dL 84  BUN 6 - 24 mg/dL 10  Creatinine 0.57 - 1.00 mg/dL 0.88  Sodium 134 - 144 mmol/L 139  Potassium 3.5 - 5.2 mmol/L 4.1  Chloride 96 - 106 mmol/L 107(H)  CO2 20 - 29 mmol/L 20  Calcium 8.7 - 10.2 mg/dL 9.2  Total Protein 6.0 - 8.5 g/dL 7.3  Total Bilirubin 0.0 - 1.2 mg/dL 0.3  Alkaline Phos 44 - 121 IU/L 75  AST 0 - 40 IU/L 16  ALT 0 - 32 IU/L 14   CBC Latest Ref Rng & Units 02/25/2021  WBC 4.0 - 10.5 K/uL 4.5  Hemoglobin 12.0 - 15.0 g/dL 12.3  Hematocrit 36.0 - 46.0 % 37.2  Platelets 150 - 400 K/uL 216     Assessment and plan- Patient is a 46 y.o. female with history of right breast DCIS ER positive here for routine follow-up  Clinically patient is doing well with no concerning signs and symptoms of recurrence based on today's exam.  She will be due for a diagnostic bilateral mammogram in April 2023 which would be scheduled by Dr. Peyton Najjar.  Patient has elected to not pursue adjuvant endocrine therapy.  I will see her back in 6 months no labs   Visit Diagnosis 1. Encounter for follow-up surveillance of ductal carcinoma in situ (DCIS) of breast      Dr. Randa Evens, MD, MPH [facility] [phone] 10/29/2021 10:23 AM

## 2021-10-29 NOTE — Progress Notes (Signed)
Radiation Oncology Follow up Note  Name: Ebony Steele   Date:   10/29/2021 MRN:  [id number] DOB: 02/02/1976    This 46 y.o. female presents to the clinic today for 30-month follow-up status post whole breast radiation to her right breast for ER positive ductal carcinoma in situ.  REFERRING PROVIDER: Virginia Crews, MD  HPI: Patient is a 46 year old female now about 6 months having completed whole breast radiation to her right breast for ductal carcinoma in situ ER positive.  Seen today in routine follow-up she is doing well occasionally has some tenderness in her right axilla most likely secondary to scarring.  Otherwise specifically denies breast tenderness cough or bone pain..  She has declined a aromatase inhibitor.  She has not yet had a mammogram follow-up  COMPLICATIONS OF TREATMENT: none  FOLLOW UP COMPLIANCE: keeps appointments   PHYSICAL EXAM:  There were no vitals taken for this visit. Lungs are clear to A&P cardiac examination essentially unremarkable with regular rate and rhythm. No dominant mass or nodularity is noted in either breast in 2 positions examined. Incision is well-healed. No axillary or supraclavicular adenopathy is appreciated. Cosmetic result is excellent.  Well-developed well-nourished patient in NAD. HEENT reveals PERLA, EOMI, discs not visualized.  Oral cavity is clear. No oral mucosal lesions are identified. Neck is clear without evidence of cervical or supraclavicular adenopathy. Lungs are clear to A&P. Cardiac examination is essentially unremarkable with regular rate and rhythm without murmur rub or thrill. Abdomen is benign with no organomegaly or masses noted. Motor sensory and DTR levels are equal and symmetric in the upper and lower extremities. Cranial nerves II through XII are grossly intact. Proprioception is intact. No peripheral adenopathy or edema is identified. No motor or sensory levels are noted. Crude visual fields are within normal  range.  RADIOLOGY RESULTS: No current films to review  PLAN: Present time patient is doing well out 6 months from whole breast radiation and pleased with her overall progress.  I have asked to see her back in 6 months for follow-up.  She will have mammograms prior to that next visit.  Patient is to call with any concerns.  I would like to take this opportunity to thank you for allowing me to participate in the care of your patient.Noreene Filbert, MD

## 2021-10-30 ENCOUNTER — Other Ambulatory Visit: Payer: Self-pay | Admitting: General Surgery

## 2021-10-30 DIAGNOSIS — Z853 Personal history of malignant neoplasm of breast: Secondary | ICD-10-CM

## 2021-11-14 ENCOUNTER — Ambulatory Visit: Payer: Self-pay | Admitting: Family Medicine

## 2021-12-02 ENCOUNTER — Encounter: Payer: Self-pay | Admitting: Family Medicine

## 2021-12-02 ENCOUNTER — Ambulatory Visit (INDEPENDENT_AMBULATORY_CARE_PROVIDER_SITE_OTHER): Payer: Self-pay | Admitting: Family Medicine

## 2021-12-02 VITALS — BP 129/88 | HR 60 | Temp 98.2°F | Resp 16 | Wt 137.5 lb

## 2021-12-02 DIAGNOSIS — E039 Hypothyroidism, unspecified: Secondary | ICD-10-CM

## 2021-12-02 DIAGNOSIS — F331 Major depressive disorder, recurrent, moderate: Secondary | ICD-10-CM

## 2021-12-02 DIAGNOSIS — F411 Generalized anxiety disorder: Secondary | ICD-10-CM

## 2021-12-02 MED ORDER — LEVOTHYROXINE SODIUM 88 MCG PO TABS: 88.0000 ug | ORAL_TABLET | Freq: Every day | ORAL | 3 refills | Status: AC

## 2021-12-02 MED ORDER — OMEPRAZOLE 40 MG PO CPDR: 40.0000 mg | DELAYED_RELEASE_CAPSULE | Freq: Every day | ORAL | 3 refills | Status: AC

## 2021-12-02 MED ORDER — BUPROPION HCL ER (XL) 450 MG PO TB24: 450.0000 mg | ORAL_TABLET | Freq: Every day | ORAL | 1 refills | Status: DC

## 2021-12-02 NOTE — Assessment & Plan Note (Signed)
Chronic and uncontrolled ?Increase wellbutrin to 450 mg daily ?Encourage therapy ?

## 2021-12-02 NOTE — Assessment & Plan Note (Signed)
Previously well controlled Continue Synthroid at current dose  Recheck TSH and adjust Synthroid as indicated   

## 2021-12-02 NOTE — Assessment & Plan Note (Signed)
Chronic and uncontrolled ?Has failed paxil, cymbalta, zyprexa, prozac, zoloft in the past due to side effects ?Increase Wellbutrin XL to 450 mg daily ?Consider abilify in the future ?Encourage therapy ?

## 2021-12-02 NOTE — Progress Notes (Signed)
?  ? ?I,Ebony Steele,acting as a scribe for Lavon Paganini, MD.,have documented all relevant documentation on the behalf of Lavon Paganini, MD,as directed by  Lavon Paganini, MD while in the presence of Lavon Paganini, MD.  ? ?Established patient visit ? ? ?Patient: Ebony Steele   DOB: [birth year]   46 y.o. Female  MRN: [id number] ?Visit Date: 12/02/2021 ? ?Today's healthcare provider: Lavon Paganini, MD  ? ?Chief Complaint  ?Patient presents with  ? Follow-up  ? ?Subjective  ?  ?HPI  ?Depression, Follow-up ? ?She  was last seen for this 9 months ago. ?Changes made at last visit include no changes. ?  ?She reports excellent compliance with treatment. ?She is not having side effects.  ? ?She reports excellent tolerance of treatment. ?Current symptoms include: depressed mood, difficulty concentrating, fatigue, feelings of worthlessness/guilt, hopelessness, and insomnia ?She feels she is Unchanged since last visit. ? ?Increased stress with her mom recently.   ? ? ?  12/02/2021  ?  8:23 AM 02/25/2021  ?  1:16 PM 10/09/2020  ? 10:31 AM  ?Depression screen PHQ 2/9  ?Decreased Interest '3 3 1  '$ ?Down, Depressed, Hopeless '3 2 1  '$ ?PHQ - 2 Score '6 5 2  '$ ?Altered sleeping '3 3 2  '$ ?Tired, decreased energy '3 3 1  '$ ?Change in appetite '3 1 2  '$ ?Feeling bad or failure about yourself  2 0 0  ?Trouble concentrating '2 2 1  '$ ?Moving slowly or fidgety/restless 2 2 0  ?Suicidal thoughts 1 0 0  ?PHQ-9 Score '22 16 8  '$ ?Difficult doing work/chores Very difficult Somewhat difficult Somewhat difficult  ?  ?----------------------------------------------------------------------------------------- ?Anxiety, Follow-up ? ?She was last seen for anxiety 9 months ago. ?Changes made at last visit include no changes. ?  ?She reports excellent compliance with treatment. ?She reports excellent tolerance of treatment. ?She is not having side effects.  ? ?She feels her anxiety is severe and Unchanged since last visit. ? ?Symptoms: ?No chest pain Yes  difficulty concentrating  ?No dizziness Yes fatigue  ?Yes feelings of losing control Yes insomnia  ?Yes irritable Yes palpitations  ?Yes panic attacks-minimal Yes racing thoughts  ?Yes shortness of breath No sweating  ?No tremors/shakes   ? ?GAD-7 Results ? ?  12/02/2021  ?  8:27 AM 10/09/2020  ? 10:33 AM 09/24/2020  ?  9:05 AM  ?GAD-7 Generalized Anxiety Disorder Screening Tool  ?1. Feeling Nervous, Anxious, or on Edge '2 1 1  '$ ?2. Not Being Able to Stop or Control Worrying '3 1 1  '$ ?3. Worrying Too Much About Different Things '3 1 2  '$ ?4. Trouble Relaxing '3 1 2  '$ ?5. Being So Restless it's Hard To Sit Still 2 1 0  ?6. Becoming Easily Annoyed or Irritable '3 2 2  '$ ?7. Feeling Afraid As If Something Awful Might Happen 3 0 0  ?Total GAD-7 Score '19 7 8  '$ ?Difficulty At Work, Home, or Getting  Along With Others? Extremely difficult Somewhat difficult Very difficult  ? ? ?PHQ-9 Scores ? ?  12/02/2021  ?  8:23 AM 02/25/2021  ?  1:16 PM 10/09/2020  ? 10:31 AM  ?PHQ9 SCORE ONLY  ?PHQ-9 Total Score '22 16 8  '$ ? ? ?--------------------------------------------------------------------------------------------------- ?Hypothyroid, follow-up ? ?Lab Results  ?Component Value Date  ? TSH 2.570 02/25/2021  ? TSH 3.190 09/10/2020  ? TSH 2.730 05/15/2020  ? FREET4 0.82 06/29/2018  ? ? ?Wt Readings from Last 3 Encounters:  ?12/02/21 137 lb 8 oz (62.4 kg)  ?  10/29/21 136 lb 12.8 oz (62.1 kg)  ?04/22/21 138 lb 14.4 oz (63 kg)  ? ? ?She was last seen for hypothyroid 9 months ago.  ?Management since that visit includes no changes. ?She reports excellent compliance with treatment. ?She is not having side effects.  ? ?Symptoms: ?Yes change in energy level Yes constipation  ?Yes diarrhea Yes heat / cold intolerance  ?Yes nervousness Yes palpitations  ?No weight changes   ? ?----------------------------------------------------------------------------------------- ? ? ?Medications: ?Outpatient Medications Prior to Visit  ?Medication Sig  ? albuterol (VENTOLIN HFA)  108 (90 Base) MCG/ACT inhaler Inhale 1-2 puffs into the lungs every 6 (six) hours as needed for wheezing or shortness of breath.  ? ASHWAGANDHA PO Take 1 tablet by mouth daily. Goli Gummies  ? ibuprofen (ADVIL) 200 MG tablet Take 200-400 mg by mouth every 6 (six) hours as needed for moderate pain.  ? polyethylene glycol (MIRALAX / GLYCOLAX) 17 g packet Take 17 g by mouth daily as needed.  ? traZODone (DESYREL) 50 MG tablet Take 1-2 tablets (50-100 mg total) by mouth at bedtime as needed for sleep. (Patient taking differently: Take 50 mg by mouth at bedtime.)  ? [DISCONTINUED] buPROPion (WELLBUTRIN XL) 300 MG 24 hr tablet Take 1 tablet by mouth once daily  ? [DISCONTINUED] levothyroxine (SYNTHROID) 88 MCG tablet Take 1 tablet (88 mcg total) by mouth daily.  ? [DISCONTINUED] omeprazole (PRILOSEC) 40 MG capsule Take 1 capsule (40 mg total) by mouth daily.  ? ?No facility-administered medications prior to visit.  ? ? ?Review of Systems per HPI ? ? ?  Objective  ?  ?BP 129/88 (BP Location: Left Arm, Patient Position: Sitting, Cuff Size: Normal)   Pulse 60   Temp 98.2 ?F (36.8 ?C) (Oral)   Resp 16   Wt 137 lb 8 oz (62.4 kg)   BMI 23.60 kg/m?  ? ? ?Physical Exam ?Vitals reviewed.  ?Constitutional:   ?   General: She is not in acute distress. ?   Appearance: Normal appearance. She is well-developed. She is not diaphoretic.  ?HENT:  ?   Head: Normocephalic and atraumatic.  ?Eyes:  ?   General: No scleral icterus. ?   Conjunctiva/sclera: Conjunctivae normal.  ?Neck:  ?   Thyroid: No thyromegaly.  ?Cardiovascular:  ?   Rate and Rhythm: Normal rate and regular rhythm.  ?   Pulses: Normal pulses.  ?   Heart sounds: Normal heart sounds. No murmur heard. ?Pulmonary:  ?   Effort: Pulmonary effort is normal. No respiratory distress.  ?   Breath sounds: Normal breath sounds. No wheezing, rhonchi or rales.  ?Musculoskeletal:  ?   Cervical back: Neck supple.  ?   Right lower leg: No edema.  ?   Left lower leg: No edema.   ?Lymphadenopathy:  ?   Cervical: No cervical adenopathy.  ?Skin: ?   General: Skin is warm and dry.  ?   Findings: No rash.  ?Neurological:  ?   Mental Status: She is alert and oriented to person, place, and time. Mental status is at baseline.  ?Psychiatric:     ?   Mood and Affect: Mood normal.     ?   Behavior: Behavior normal.  ?  ? ? ?No results found for any visits on 12/02/21. ? Assessment & Plan  ?  ? ?Problem List Items Addressed This Visit   ? ?  ? Endocrine  ? Hypothyroidism  ?  Previously well controlled ?Continue Synthroid at current dose  ?  Recheck TSH and adjust Synthroid as indicated   ?  ?  ? Relevant Medications  ? levothyroxine (SYNTHROID) 88 MCG tablet  ? Other Relevant Orders  ? TSH  ?  ? Other  ? Clinical depression  ?  Chronic and uncontrolled ?Has failed paxil, cymbalta, zyprexa, prozac, zoloft in the past due to side effects ?Increase Wellbutrin XL to 450 mg daily ?Consider abilify in the future ?Encourage therapy ?  ?  ? Relevant Medications  ? buPROPion 450 MG TB24  ? GAD (generalized anxiety disorder) - Primary  ?  Chronic and uncontrolled ?Increase wellbutrin to 450 mg daily ?Encourage therapy ?  ?  ? Relevant Medications  ? buPROPion 450 MG TB24  ?  ? ?Return in about 3 months (around 03/03/2022) for MDD/GAD f/u, virtual ok.  ?   ? ?I, Lavon Paganini, MD, have reviewed all documentation for this visit. The documentation on 12/02/21 for the exam, diagnosis, procedures, and orders are all accurate and complete. ? ? ?Virginia Crews, MD, MPH ?[place] ?[facility]   ?

## 2021-12-03 LAB — TSH: TSH: 4.28 u[IU]/mL (ref 0.450–4.500)

## 2021-12-11 ENCOUNTER — Ambulatory Visit
Admission: RE | Admit: 2021-12-11 | Discharge: 2021-12-11 | Disposition: A | Discharge status: Home / Self Care | Payer: Self-pay | Source: Ambulatory Visit | Attending: General Surgery | Admitting: General Surgery

## 2021-12-11 DIAGNOSIS — Z853 Personal history of malignant neoplasm of breast: Secondary | ICD-10-CM | POA: Insufficient documentation

## 2022-02-28 NOTE — Progress Notes (Unsigned)
MyChart Video Visit    Virtual Visit via Video Note   This visit type was conducted due to national recommendations for restrictions regarding the COVID-19 Pandemic (e.g. social distancing) in an effort to limit this patient's exposure and mitigate transmission in our community. This patient is at least at moderate risk for complications without adequate follow up. This format is felt to be most appropriate for this patient at this time. Physical exam was limited by quality of the video and audio technology used for the visit.   Patient location: home office Provider location: [facility] [address]  [place] [address] [place], [place] [address]   I discussed the limitations of evaluation and management by telemedicine and the availability of in person appointments. The patient expressed understanding and agreed to proceed.  Patient: Ebony Steele   DOB: [birth year]   46 y.o. Female  MRN: [id number] Visit Date: 03/06/2022  Today's healthcare provider: Gwyneth Sprout, FNP  Introduced to nurse practitioner role and practice setting.  All questions answered.  Discussed provider/patient relationship and expectations.   I,Willena Jeancharles J Shereda Graw,acting as a scribe for Gwyneth Sprout, FNP.,have documented all relevant documentation on the behalf of Gwyneth Sprout, FNP,as directed by  Gwyneth Sprout, FNP while in the presence of Gwyneth Sprout, FNP.   Chief Complaint  Patient presents with   Anxiety   Depression   Subjective    HPI  Anxiety/Depression, Follow-up  She was last seen for anxiety 3 months ago. Changes made at last visit include Increase Wellbutrin XL to 450 mg.   She reports poor compliance with treatment. Patient states the increased medication cost more that was not affordable so she has been taking '300mg'$  with no improvement. She reports fair tolerance of treatment. She is not having side effects.   She feels her anxiety is severe and Unchanged since last  visit.  Symptoms: No chest pain Yes difficulty concentrating  No dizziness No fatigue  No feelings of losing control Yes insomnia  No irritable No palpitations  Yes panic attacks Yes racing thoughts  No shortness of breath Yes sweating  Yes tremors/shakes    GAD-7 Results    03/06/2022   10:35 AM 12/02/2021    8:27 AM 10/09/2020   10:33 AM  GAD-7 Generalized Anxiety Disorder Screening Tool  1. Feeling Nervous, Anxious, or on Edge '3 2 1  '$ 2. Not Being Able to Stop or Control Worrying '3 3 1  '$ 3. Worrying Too Much About Different Things '3 3 1  '$ 4. Trouble Relaxing '3 3 1  '$ 5. Being So Restless it's Hard To Sit Still '3 2 1  '$ 6. Becoming Easily Annoyed or Irritable '3 3 2  '$ 7. Feeling Afraid As If Something Awful Might Happen 3 3 0  Total GAD-7 Score '21 19 7  '$ Difficulty At Work, Home, or Getting  Along With Others? Very difficult Extremely difficult Somewhat difficult    PHQ-9 Scores    03/06/2022   10:33 AM 12/02/2021    8:23 AM 02/25/2021    1:16 PM  PHQ9 SCORE ONLY  PHQ-9 Total Score '19 22 16       '$ 03/06/2022   10:33 AM 12/02/2021    8:23 AM 02/25/2021    1:16 PM  Depression screen PHQ 2/9  Decreased Interest '3 3 3  '$ Down, Depressed, Hopeless '3 3 2  '$ PHQ - 2 Score '6 6 5  '$ Altered sleeping '2 3 3  '$ Tired, decreased energy 1 3 3  Change in appetite '3 3 1  '$ Feeling bad or failure about yourself  3 2 0  Trouble concentrating '2 2 2  '$ Moving slowly or fidgety/restless '2 2 2  '$ Suicidal thoughts 0 1 0  PHQ-9 Score '19 22 16  '$ Difficult doing work/chores Very difficult Very difficult Somewhat difficult    -----------------------------------------------------------------------------------------  ---------------------------------------------------------------------------------------------------    Medications: Outpatient Medications Prior to Visit  Medication Sig   albuterol (VENTOLIN HFA) 108 (90 Base) MCG/ACT inhaler Inhale 1-2 puffs into the lungs every 6 (six) hours as needed for wheezing or  shortness of breath.   ASHWAGANDHA PO Take 1 tablet by mouth daily. Goli Gummies   ibuprofen (ADVIL) 200 MG tablet Take 200-400 mg by mouth every 6 (six) hours as needed for moderate pain.   levothyroxine (SYNTHROID) 88 MCG tablet Take 1 tablet (88 mcg total) by mouth daily.   omeprazole (PRILOSEC) 40 MG capsule Take 1 capsule (40 mg total) by mouth daily.   polyethylene glycol (MIRALAX / GLYCOLAX) 17 g packet Take 17 g by mouth daily as needed.   traZODone (DESYREL) 50 MG tablet Take 1-2 tablets (50-100 mg total) by mouth at bedtime as needed for sleep. (Patient taking differently: Take 50 mg by mouth at bedtime.)   [DISCONTINUED] buPROPion 450 MG TB24 Take 450 mg by mouth daily.   No facility-administered medications prior to visit.    Review of Systems     Objective    There were no vitals taken for this visit.     Physical Exam Constitutional:      Appearance: Normal appearance.  Pulmonary:     Effort: Pulmonary effort is normal.  Neurological:     General: No focal deficit present.     Mental Status: She is alert and oriented to person, place, and time.  Psychiatric:        Attention and Perception: Attention normal.        Mood and Affect: Mood is depressed. Affect is tearful.        Speech: Speech normal.        Behavior: Behavior normal.        Thought Content: Thought content normal. Thought content does not include homicidal or suicidal ideation. Thought content does not include homicidal or suicidal plan.        Cognition and Memory: Cognition and memory normal.        Judgment: Judgment normal.        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 Passing of mom, 2 months ago Depression has worsened       Relevant Medications   buPROPion (WELLBUTRIN XL) 300 MG 24 hr tablet   buPROPion (WELLBUTRIN XL) 150 MG 24 hr tablet   Other Relevant  Orders   AMB Referral to Community Care Coordinaton   Depression, recurrent ([place])    Chronic, worsening/unstable Was not able to get 450 mg d/t lack of insurance/cost Now with insurance Previously uncontrolled on 300 mg Exacerbated since passing of her mother 2 months ago Has immediate family, husband, and daughters who assist; however, recommend EAP counseling through job and connection with CCM team for therapist, re-establishing Contracted to safety; denies SI or HI RTC/virtual OK for 6 week f/u; previously discussed with PCP start of Abilify to assist if '450mg'$  was not effective  Relevant Medications   buPROPion (WELLBUTRIN XL) 300 MG 24 hr tablet   buPROPion (WELLBUTRIN XL) 150 MG 24 hr tablet     Return in about 6 weeks (around 04/17/2022) for anxiety and depression.     I discussed the assessment and treatment plan with the patient. The patient was provided an opportunity to ask questions and all were answered. The patient agreed with the plan and demonstrated an understanding of the instructions.   The patient was advised to call back or seek an in-person evaluation if the symptoms worsen or if the condition fails to improve as anticipated.  I provided 10 minutes of face-to-face time during this encounter discussing current mood, symptoms and medication management.  Vonna Kotyk, FNP, have reviewed all documentation for this visit. The documentation on 03/06/22 for the exam, diagnosis, procedures, and orders are all accurate and complete.   Gwyneth Sprout, [place] [phone] (phone) [phone] (fax)  [place]

## 2022-03-06 ENCOUNTER — Telehealth (INDEPENDENT_AMBULATORY_CARE_PROVIDER_SITE_OTHER): Payer: 59 | Admitting: Family Medicine

## 2022-03-06 ENCOUNTER — Encounter: Payer: Self-pay | Admitting: Family Medicine

## 2022-03-06 ENCOUNTER — Telehealth: Payer: Self-pay | Admitting: Family Medicine

## 2022-03-06 DIAGNOSIS — F4321 Adjustment disorder with depressed mood: Secondary | ICD-10-CM

## 2022-03-06 DIAGNOSIS — F339 Major depressive disorder, recurrent, unspecified: Secondary | ICD-10-CM | POA: Diagnosis not present

## 2022-03-06 MED ORDER — BUPROPION HCL ER (XL) 300 MG PO TB24: 300.0000 mg | ORAL_TABLET | Freq: Every day | ORAL | 1 refills | Status: DC

## 2022-03-06 MED ORDER — BUPROPION HCL ER (XL) 150 MG PO TB24: 150.0000 mg | ORAL_TABLET | Freq: Every day | ORAL | 1 refills | Status: DC

## 2022-03-06 NOTE — Assessment & Plan Note (Signed)
1 year anniversary of personal cancer diagnosis Passing of mom, 2 months ago Depression has worsened

## 2022-03-06 NOTE — Assessment & Plan Note (Signed)
Chronic, worsening/unstable Was not able to get 450 mg d/t lack of insurance/cost Now with insurance Previously uncontrolled on 300 mg Exacerbated since passing of her mother 2 months ago Has immediate family, husband, and daughters who assist; however, recommend EAP counseling through job and connection with CCM team for therapist, re-establishing Contracted to safety; denies SI or HI RTC/virtual OK for 6 week f/u; previously discussed with PCP start of Abilify to assist if '450mg'$  was not effective

## 2022-03-07 ENCOUNTER — Telehealth: Payer: Self-pay

## 2022-03-07 NOTE — Chronic Care Management (AMB) (Signed)
  Care Management   Note  03/07/2022 Name: KRISLYNN GRONAU MRN: [id number] DOB: [birth year]  Carrianne Carlean Jews Berrey is a 46 y.o. year old female who is a primary care patient of Bacigalupo, Dionne Bucy, MD. I reached out to Marshall Cork by phone today offer care coordination services.   Ms. Mccamy was given information about care management services today including:  Care management services include personalized support from designated clinical staff supervised by her physician, including individualized plan of care and coordination with other care providers 24/7 contact phone numbers for assistance for urgent and routine care needs. The patient may stop care management services at any time by phone call to the office staff.  Patient agreed to services and verbal consent obtained.   Follow up plan: Telephone appointment with care management team member scheduled for:03/11/2022  Noreene Larsson, [place], [place], [place] [address] Direct Dial: [phone] [email or web address]: [email or web address]

## 2022-03-11 ENCOUNTER — Telehealth: Payer: 59

## 2022-03-11 ENCOUNTER — Ambulatory Visit: Payer: Self-pay | Admitting: *Deleted

## 2022-03-11 NOTE — Patient Outreach (Addendum)
  Care Coordination   Initial Visit Note   03/11/2022 Name: Ebony Steele MRN: [id number] DOB: 1976-02-14  Ebony Steele is a 46 y.o. year old female who sees Bacigalupo, Dionne Bucy, MD for primary care. I spoke with  Ebony Steele by phone today  What matters to the patients health and wellness today?  Grief Counseling   Goals Addressed               This Visit's Progress     Grief Counseling (pt-stated)        Care Coordination Interventions:   Motivational Interviewing employed PHQ2/PHQ9 completed Solution-Focused Strategies employed:  Active listening / Reflection utilized  Emotional Support Provided         SDOH assessments and interventions completed:   Yes   Care Coordination Interventions Activated:  Yes Care Coordination Interventions:   Yes, provided  Follow up plan: Follow up call scheduled for 03/18/22  Encounter Outcome:  Pt. Visit Completed  [place], [place] Management [phone]

## 2022-03-11 NOTE — Patient Instructions (Signed)
Visit Information  Thank you for taking time to visit with me today. Please don't hesitate to contact me if I can be of assistance to you before our next scheduled telephone appointment.  Following are the goals we discussed today:  (Copy and paste patient goals from clinical care plan here)  Our next appointment is by telephone on 03/18/22 at 3pm  Please call the care guide team at [phone] if you need to cancel or reschedule your appointment.   If you are experiencing a Mental Health or [place] or need someone to talk to, please call the Suicide and Crisis Lifeline: 988   Following is a copy of your full plan of care:  There are no care plans that you recently modified to display for this patient.   Ebony Steele was given information about Care Management services by the embedded care coordination team including:  Care Management services include personalized support from designated clinical staff supervised by her physician, including individualized plan of care and coordination with other care providers 24/7 contact phone numbers for assistance for urgent and routine care needs. The patient may stop CCM services at any time (effective at the end of the month) by phone call to the office staff.  Patient agreed to services and verbal consent obtained.   Patient verbalizes understanding of instructions and care plan provided today and agrees to view in [place]. Active MyChart status and patient understanding of how to access instructions and care plan via MyChart confirmed with patient.     [name] [facility] Care Management [phone]

## 2022-03-18 ENCOUNTER — Ambulatory Visit: Payer: Self-pay | Admitting: *Deleted

## 2022-03-18 NOTE — Patient Outreach (Addendum)
  Care Coordination   Follow Up Visit Note   03/18/2022 Name: Ebony Steele MRN: [id number] DOB: July 10, 1976  Ebony Steele is a 46 y.o. year old female who sees Bacigalupo, Dionne Bucy, MD for primary care. I spoke with  Ebony Steele by phone today  What matters to the patients health and wellness today? Grief Counseing referral   Goals Addressed               This Visit's Progress     Grief Counseling (pt-stated)        Care Coordination Interventions:   Follow up regarding referral to grief counseling Confirmed that patient has contacted Hospice and Palliative Care, she missed their call yesterday and is agreeable to calling them back to schedule the initial appointment Active listening / Reflection utilized  Continued to provide emotional Support           SDOH assessments and interventions completed:   Yes   Care Coordination Interventions Activated:  Yes Care Coordination Interventions:   Yes, provided  Follow up plan: Follow up call scheduled for 04/01/22  Encounter Outcome:  Pt. Visit Completed

## 2022-03-18 NOTE — Patient Instructions (Signed)
Visit Information  Thank you for taking time to visit with me today. Please don't hesitate to contact me if I can be of assistance to you.   Following are the goals we discussed today:   Goals Addressed               This Visit's Progress     Grief Counseling (pt-stated)        Care Coordination Interventions:   Follow up regarding referral to grief counseling Confirmed that patient has contacted Hospice and Palliative Care, she missed their call yesterday and is agreeable to calling them back to schedule the initial appointment Active listening / Reflection utilized  Continued to provide emotional Support           Our next appointment is by telephone on 04/01/22 at 3pm  Please call the care guide team at [phone] if you need to cancel or reschedule your appointment.   If you are experiencing a Mental Health or [place] or need someone to talk to, please call the Suicide and Crisis Lifeline: 988   Patient verbalizes understanding of instructions and care plan provided today and agrees to view in [place]. Active MyChart status and patient understanding of how to access instructions and care plan via MyChart confirmed with patient.      [name] [facility] Care Management [phone]

## 2022-04-07 ENCOUNTER — Ambulatory Visit: Payer: Self-pay | Admitting: *Deleted

## 2022-04-07 ENCOUNTER — Encounter: Payer: Self-pay | Admitting: *Deleted

## 2022-04-07 NOTE — Patient Outreach (Signed)
  Care Coordination   04/07/2022 Name: Ebony Steele MRN: [id number] DOB: 19-Sep-1975   Care Coordination Outreach Attempts:  An unsuccessful telephone outreach was attempted today to offer the patient information about available care coordination services as a benefit of their health plan.   Follow Up Plan:  Additional outreach attempts will be made to offer the patient care coordination information and services.   Encounter Outcome:  No Answer  Care Coordination Interventions Activated:  No   Care Coordination Interventions:  No, not indicated    [place], [place] Management [phone]

## 2022-04-08 ENCOUNTER — Ambulatory Visit: Payer: Self-pay | Admitting: *Deleted

## 2022-04-08 NOTE — Patient Outreach (Signed)
  Care Coordination   04/08/2022 Name: Ebony Steele MRN: [id number] DOB: 1976-09-01   Care Coordination Outreach Attempts:  A second unsuccessful outreach was attempted today to offer the patient with information about available care coordination services as a benefit of their health plan.     Follow Up Plan:  Additional outreach attempts will be made to offer the patient care coordination information and services.   Encounter Outcome:  No Answer  Care Coordination Interventions Activated:  No   Care Coordination Interventions:  No, not indicated    Lizabeth Fellner, [place] Worker  [facility] Care Management [phone]

## 2022-04-17 NOTE — Progress Notes (Signed)
MyChart Video Visit    Virtual Visit via Video Note   This visit type was conducted due to national recommendations for restrictions regarding the COVID-19 Pandemic (e.g. social distancing) in an effort to limit this patient's exposure and mitigate transmission in our community. This patient is at least at moderate risk for complications without adequate follow up. This format is felt to be most appropriate for this patient at this time. Physical exam was limited by quality of the video and audio technology used for the visit.   Patient location: home Provider location: [facility] [address]  [place] [address] [place], [place] [address]   I discussed the limitations of evaluation and management by telemedicine and the availability of in person appointments. The patient expressed understanding and agreed to proceed.  Patient: Ebony Steele   DOB: 1976-06-10   46 y.o. Female  MRN: [id number] Visit Date: 04/21/2022  Today's healthcare provider: Gwyneth Sprout, FNP  Re Introduced to nurse practitioner role and practice setting.  All questions answered.  Discussed provider/patient relationship and expectations.   I,Tiffany J Bragg,acting as a scribe for Gwyneth Sprout, FNP.,have documented all relevant documentation on the behalf of Gwyneth Sprout, FNP,as directed by  Gwyneth Sprout, FNP while in the presence of Gwyneth Sprout, FNP.   Chief Complaint  Patient presents with   Anxiety   Depression   Subjective    HPI  Anxiety/Depression, Follow-up  She was last seen for anxiety 6 weeks ago. Changes made at last visit include continue bupropion '450mg'$ , increase wellbutrin to '450mg'$ .   She reports excellent compliance with treatment. She reports excellent tolerance of treatment. She is not having side effects.   She feels her anxiety is moderate and Improved since last visit.  Symptoms: No chest pain No difficulty concentrating  No dizziness No fatigue  No feelings  of losing control No insomnia  Yes irritable No palpitations  No panic attacks No racing thoughts  No shortness of breath No sweating  No tremors/shakes    GAD-7 Results    04/21/2022   11:01 AM 03/06/2022   10:35 AM 12/02/2021    8:27 AM  GAD-7 Generalized Anxiety Disorder Screening Tool  1. Feeling Nervous, Anxious, or on Edge '1 3 2  '$ 2. Not Being Able to Stop or Control Worrying '1 3 3  '$ 3. Worrying Too Much About Different Things '1 3 3  '$ 4. Trouble Relaxing '2 3 3  '$ 5. Being So Restless it's Hard To Sit Still '2 3 2  '$ 6. Becoming Easily Annoyed or Irritable '2 3 3  '$ 7. Feeling Afraid As If Something Awful Might Happen 0 3 3  Total GAD-7 Score '9 21 19  '$ Difficulty At Work, Home, or Getting  Along With Others? Somewhat difficult Very difficult Extremely difficult    PHQ-9 Scores    04/21/2022   10:59 AM 03/11/2022    9:25 AM 03/06/2022   10:33 AM  PHQ9 SCORE ONLY  PHQ-9 Total Score '13 15 19    '$ ---------------------------------------------------------------------------------------------------    Medications: Outpatient Medications Prior to Visit  Medication Sig   albuterol (VENTOLIN HFA) 108 (90 Base) MCG/ACT inhaler Inhale 1-2 puffs into the lungs every 6 (six) hours as needed for wheezing or shortness of breath.   ASHWAGANDHA PO Take 1 tablet by mouth daily. Goli Gummies   buPROPion (WELLBUTRIN XL) 150 MG 24 hr tablet Take 1 tablet (150 mg total) by mouth daily. Take with 300 mg   buPROPion (  WELLBUTRIN XL) 300 MG 24 hr tablet Take 1 tablet (300 mg total) by mouth daily. Take with 150 mg   ibuprofen (ADVIL) 200 MG tablet Take 200-400 mg by mouth every 6 (six) hours as needed for moderate pain.   levothyroxine (SYNTHROID) 88 MCG tablet Take 1 tablet (88 mcg total) by mouth daily.   omeprazole (PRILOSEC) 40 MG capsule Take 1 capsule (40 mg total) by mouth daily.   polyethylene glycol (MIRALAX / GLYCOLAX) 17 g packet Take 17 g by mouth daily as needed.   traZODone (DESYREL) 50 MG tablet  Take 1-2 tablets (50-100 mg total) by mouth at bedtime as needed for sleep. (Patient taking differently: Take 50 mg by mouth at bedtime.)   No facility-administered medications prior to visit.    Review of Systems    Objective    There were no vitals taken for this visit.    Physical Exam Constitutional:      Appearance: Normal appearance.  HENT:     Head: Normocephalic.  Abdominal:     General: Abdomen is flat.  Neurological:     Mental Status: She is alert.  Psychiatric:        Attention and Perception: Attention normal.        Mood and Affect: Mood normal. Affect is tearful.        Speech: Speech normal.        Behavior: Behavior normal.        Thought Content: Thought content normal. Thought content does not include homicidal or suicidal ideation. Thought content does not include homicidal or suicidal plan.        Cognition and Memory: Cognition and memory normal.        Judgment: Judgment normal.        Assessment & Plan     Problem List Items Addressed This Visit       Other   Complicated grief - Primary    Chronic, improved Patient encouraged to reach out to family, friends, children Did not that she went out to lunch with a niece this weekend; which she enjoyed       Depression, recurrent ([place])    Chronic, improved Continue 300 +150 of Wellbutrin Defer addition of Abilify 2 mg at this time; given good results following 6 weeks of dose change Continues to remain tearful in thought to mother's passing Contracted to safety; denies SI or HI      GAD (generalized anxiety disorder)    Chronic, improved Continue 300 +150 of Wellbutrin to assist Has spoken with SW through [facility] Has not sought out other services through EAP Denies further changes at this time         Return in about 3 months (around 07/22/2022) for annual examination w PCP.     I discussed the assessment and treatment plan with the patient. The patient was provided an opportunity to  ask questions and all were answered. The patient agreed with the plan and demonstrated an understanding of the instructions.   The patient was advised to call back or seek an in-person evaluation if the symptoms worsen or if the condition fails to improve as anticipated.  I provided 10 minutes of face-to-face time during this encounter discussing previous GAD, depression and grief and both pharm and non-pharm treatment options .  Vonna Kotyk, FNP, have reviewed all documentation for this visit. The documentation on 04/21/22 for the exam, diagnosis, procedures, and orders are all accurate and complete.   Daneil Dan  Waynetta Pean, [place] [phone] (phone) [phone] (fax)  [place]

## 2022-04-21 ENCOUNTER — Encounter: Payer: Self-pay | Admitting: Family Medicine

## 2022-04-21 ENCOUNTER — Telehealth (INDEPENDENT_AMBULATORY_CARE_PROVIDER_SITE_OTHER): Payer: 59 | Admitting: Family Medicine

## 2022-04-21 DIAGNOSIS — F4321 Adjustment disorder with depressed mood: Secondary | ICD-10-CM | POA: Diagnosis not present

## 2022-04-21 DIAGNOSIS — F339 Major depressive disorder, recurrent, unspecified: Secondary | ICD-10-CM | POA: Diagnosis not present

## 2022-04-21 DIAGNOSIS — F411 Generalized anxiety disorder: Secondary | ICD-10-CM

## 2022-04-21 NOTE — Assessment & Plan Note (Signed)
Chronic, improved Continue 300 +150 of Wellbutrin Defer addition of Abilify 2 mg at this time; given good results following 6 weeks of dose change Continues to remain tearful in thought to mother's passing Contracted to safety; denies SI or HI

## 2022-04-21 NOTE — Assessment & Plan Note (Signed)
Chronic, improved Continue 300 +150 of Wellbutrin to assist Has spoken with SW through [facility] Has not sought out other services through EAP Denies further changes at this time

## 2022-04-21 NOTE — Assessment & Plan Note (Signed)
Chronic, improved Patient encouraged to reach out to family, friends, children Did not that she went out to lunch with a niece this weekend; which she enjoyed

## 2022-04-30 ENCOUNTER — Encounter: Payer: Self-pay | Admitting: Oncology

## 2022-04-30 ENCOUNTER — Inpatient Hospital Stay: Payer: 59 | Attending: Oncology | Admitting: Oncology

## 2022-04-30 ENCOUNTER — Ambulatory Visit
Admission: RE | Admit: 2022-04-30 | Discharge: 2022-04-30 | Disposition: A | Discharge status: Home / Self Care | Payer: 59 | Source: Ambulatory Visit | Attending: Radiation Oncology | Admitting: Radiation Oncology

## 2022-04-30 VITALS — BP 133/98 | HR 99 | Temp 97.8°F | Resp 16 | Wt 129.5 lb

## 2022-04-30 DIAGNOSIS — C50211 Malignant neoplasm of upper-inner quadrant of right female breast: Secondary | ICD-10-CM

## 2022-04-30 DIAGNOSIS — Z170 Estrogen receptor positive status [ER+]: Secondary | ICD-10-CM | POA: Diagnosis not present

## 2022-04-30 DIAGNOSIS — Z08 Encounter for follow-up examination after completed treatment for malignant neoplasm: Secondary | ICD-10-CM | POA: Diagnosis not present

## 2022-04-30 DIAGNOSIS — F17290 Nicotine dependence, other tobacco product, uncomplicated: Secondary | ICD-10-CM | POA: Diagnosis not present

## 2022-04-30 DIAGNOSIS — Z79899 Other long term (current) drug therapy: Secondary | ICD-10-CM | POA: Diagnosis not present

## 2022-04-30 DIAGNOSIS — D0511 Intraductal carcinoma in situ of right breast: Secondary | ICD-10-CM | POA: Insufficient documentation

## 2022-04-30 DIAGNOSIS — Z86000 Personal history of in-situ neoplasm of breast: Secondary | ICD-10-CM | POA: Insufficient documentation

## 2022-04-30 DIAGNOSIS — Z923 Personal history of irradiation: Secondary | ICD-10-CM | POA: Insufficient documentation

## 2022-04-30 NOTE — Progress Notes (Signed)
Survivorship Care Plan visit completed.  Treatment summary reviewed and given to patient.  ASCO answers booklet reviewed and given to patient.  CARE program and Cancer Transitions discussed with patient along with other resources cancer center offers to patients and caregivers.  Patient verbalized understanding.    

## 2022-04-30 NOTE — Progress Notes (Signed)
Hematology/Oncology Consult note [facility]  [phone] [phone]  Patient Care Team: Virginia Crews, MD as PCP - General (Family Medicine) Sindy Guadeloupe, MD as Consulting Physician (Oncology) Herbert Pun, MD as Consulting Physician (General Surgery) Noreene Filbert, MD as Consulting Physician (Radiation Oncology) Vern Claude,  as Social Worker   Name of the patient: Ebony Steele  [id number]  [birth year]   Date of visit: 04/30/22  Diagnosis- right breast DCIS  Chief complaint/ Reason for visit-routine follow-up of right breast DCIS  Heme/Onc history: Patient is a 46 year old premenopausal African-American female found to have abnormal mammogram in May 2022.  This was biopsy-proven low-grade DCIS ER positive.  She underwent lumpectomy with Dr. Peyton Najjar in June 2022.  Final pathology showed 5 mm grade 1 low-grade DCIS with negative margins.  She underwent adjuvant radiation treatment.  She has decided not to pursue any adjuvant hormone therapy.  Interval history-patient is tearful today as she mother passed away 3 months ago.  She is trying to cope up with that in a positive manner.  She is now working in a new job.  ECOG PS- 0 Pain scale- 0   Review of systems- Review of Systems  Constitutional:  Negative for chills, fever, malaise/fatigue and weight loss.  HENT:  Negative for congestion, ear discharge and nosebleeds.   Eyes:  Negative for blurred vision.  Respiratory:  Negative for cough, hemoptysis, sputum production, shortness of breath and wheezing.   Cardiovascular:  Negative for chest pain, palpitations, orthopnea and claudication.  Gastrointestinal:  Negative for abdominal pain, blood in stool, constipation, diarrhea, heartburn, melena, nausea and vomiting.  Genitourinary:  Negative for dysuria, flank pain, frequency, hematuria and urgency.  Musculoskeletal:  Negative for back pain, joint pain and  myalgias.  Skin:  Negative for rash.  Neurological:  Negative for dizziness, tingling, focal weakness, seizures, weakness and headaches.  Endo/Heme/Allergies:  Does not bruise/bleed easily.  Psychiatric/Behavioral:  Negative for depression and suicidal ideas. The patient does not have insomnia.       Allergies  Allergen Reactions   Acetaminophen Anaphylaxis     Past Medical History:  Diagnosis Date   Asthma    Breast cancer (HCC)    BV (bacterial vaginosis)    Family history of colon cancer    GERD (gastroesophageal reflux disease)    Menometrorrhagia    Thyroid disease      Past Surgical History:  Procedure Laterality Date   BREAST BIOPSY Right 12/19/2020   stereo bx/ x clip/  DUCTAL CARCINOMA IN SITU   BREAST LUMPECTOMY     BREAST LUMPECTOMY Right 01/09/2021   DUCTAL CARCINOMA IN SITU   DILATION AND CURETTAGE OF UTERUS  2005   LAPAROSCOPY  2000   OOPHORECTOMY Right 2000   [place]   TUBAL LIGATION      Social History   Socioeconomic History   Marital status: Married    Spouse name: Not on file   Number of children: 3   Years of education: College   Highest education level: Not on file  Occupational History   Occupation: [employer]    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  She stopped  the end of May 2018   Sexual activity: Yes  Other Topics Concern   Not on file  Social History Narrative   Not on file   Social Determinants of Health   Financial Resource Strain: Low Risk  (03/11/2022)   Overall Financial Resource Strain (CARDIA)    Difficulty of Paying Living Expenses: Not hard at all  Food Insecurity: Not on file  Transportation Needs: Not on  file  Physical Activity: Not on file  Stress: Not on file  Social Connections: Not on file  Intimate Partner Violence: Not on file    Family History  Problem Relation Age of Onset   Diabetes Mother    Depression Mother    Hypertension Mother    Glaucoma Mother    Kidney disease Father    Diabetes Father    Diabetes Mellitus I Brother    Colon cancer Maternal Aunt      Current Outpatient Medications:    albuterol (VENTOLIN HFA) 108 (90 Base) MCG/ACT inhaler, Inhale 1-2 puffs into the lungs every 6 (six) hours as needed for wheezing or shortness of breath., Disp: 1 each, Rfl: 2   ASHWAGANDHA PO, Take 1 tablet by mouth daily. Goli Gummies, Disp: , Rfl:    buPROPion (WELLBUTRIN XL) 150 MG 24 hr tablet, Take 1 tablet (150 mg total) by mouth daily. Take with 300 mg, Disp: 90 tablet, Rfl: 1   buPROPion (WELLBUTRIN XL) 300 MG 24 hr tablet, Take 1 tablet (300 mg total) by mouth daily. Take with 150 mg, Disp: 90 tablet, Rfl: 1   ibuprofen (ADVIL) 200 MG tablet, Take 200-400 mg by mouth every 6 (six) hours as needed for moderate pain., Disp: , Rfl:    levothyroxine (SYNTHROID) 88 MCG tablet, Take 1 tablet (88 mcg total) by mouth daily., Disp: 90 tablet, Rfl: 3   omeprazole (PRILOSEC) 40 MG capsule, Take 1 capsule (40 mg total) by mouth daily., Disp: 90 capsule, Rfl: 3   polyethylene glycol (MIRALAX / GLYCOLAX) 17 g packet, Take 17 g by mouth daily as needed., Disp: , Rfl:    traZODone (DESYREL) 50 MG tablet, Take 1-2 tablets (50-100 mg total) by mouth at bedtime as needed for sleep. (Patient taking differently: Take 50 mg by mouth at bedtime.), Disp: 60 tablet, Rfl: 3  Physical exam:  Vitals:   04/30/22 1008  BP: (!) 133/98  Pulse: 99  Resp: 16  Temp: 97.8 F (36.6 C)  SpO2: 99%  Weight: 129 lb 8 oz (58.7 kg)   Physical Exam Constitutional:      General: She is not in acute distress. Cardiovascular:     Rate and Rhythm: Normal rate and regular rhythm.     Heart sounds: Normal  heart sounds.  Pulmonary:     Effort: Pulmonary effort is normal.     Breath sounds: Normal breath sounds.  Abdominal:     General: Bowel sounds are normal.     Palpations: Abdomen is soft.  Skin:    General: Skin is warm and dry.  Neurological:     Mental Status: She is alert and oriented to person, place, and time.    Breast exam was performed in seated and lying down position. Patient is status post right lumpectomy with a well-healed surgical scar. No evidence of any palpable masses. No evidence of axillary adenopathy. No evidence of any palpable masses or lumps in the left breast. No evidence  of leftt axillary adenopathy      Latest Ref Rng & Units 09/10/2020   11:17 AM  CMP  Glucose 65 - 99 mg/dL 84   BUN 6 - 24 mg/dL 10   Creatinine 0.57 - 1.00 mg/dL 0.88   Sodium 134 - 144 mmol/L 139   Potassium 3.5 - 5.2 mmol/L 4.1   Chloride 96 - 106 mmol/L 107   CO2 20 - 29 mmol/L 20   Calcium 8.7 - 10.2 mg/dL 9.2   Total Protein 6.0 - 8.5 g/dL 7.3   Total Bilirubin 0.0 - 1.2 mg/dL 0.3   Alkaline Phos 44 - 121 IU/L 75   AST 0 - 40 IU/L 16   ALT 0 - 32 IU/L 14       Latest Ref Rng & Units 02/25/2021    3:06 PM  CBC  WBC 4.0 - 10.5 K/uL 4.5   Hemoglobin 12.0 - 15.0 g/dL 12.3   Hematocrit 36.0 - 46.0 % 37.2   Platelets 150 - 400 K/uL 216      Assessment and plan- Patient is a 46 y.o. female with a right breast DCIS ER positive here for routine follow-up  Clinically patient is doing well with no concerning signs andSymptoms of recurrence based on today's exam.  Her mammogram from April 2023 was unremarkable.  Patient decided not to go on endocrine therapy after reviewing risks versus benefits.  I will see her back in 3 months no labs   Visit Diagnosis 1. Encounter for follow-up surveillance of ductal carcinoma in situ (DCIS) of breast      Dr. Randa Evens, MD, MPH [facility] at [facility] [phone] 04/30/2022 3:34 PM

## 2022-04-30 NOTE — Progress Notes (Signed)
Radiation Oncology Follow up Note  Name: Ebony Steele   Date:   04/30/2022 MRN:  [id number] DOB: 12-Jun-1976    This 46 y.o. female presents to the clinic today for 1 year follow-up status post whole breast radiation to her right breast for ER positive ductal carcinoma in situ.  REFERRING PROVIDER: Virginia Crews, MD  HPI: Patient is a 46 year old female now out 1 year having completed whole breast radiation to her right breast for ER positive ductal carcinoma in situ.  Seen today in routine follow-up she is doing well.  She specifically denies breast tenderness cough or bone pain..  She had mammograms back in April which I have reviewed were BI-RADS 2 benign.  She has declined endocrine therapy.  COMPLICATIONS OF TREATMENT: none  FOLLOW UP COMPLIANCE: keeps appointments   PHYSICAL EXAM:  There were no vitals taken for this visit. Lungs are clear to A&P cardiac examination essentially unremarkable with regular rate and rhythm. No dominant mass or nodularity is noted in either breast in 2 positions examined. Incision is well-healed. No axillary or supraclavicular adenopathy is appreciated. Cosmetic result is excellent.  Well-developed well-nourished patient in NAD. HEENT reveals PERLA, EOMI, discs not visualized.  Oral cavity is clear. No oral mucosal lesions are identified. Neck is clear without evidence of cervical or supraclavicular adenopathy. Lungs are clear to A&P. Cardiac examination is essentially unremarkable with regular rate and rhythm without murmur rub or thrill. Abdomen is benign with no organomegaly or masses noted. Motor sensory and DTR levels are equal and symmetric in the upper and lower extremities. Cranial nerves II through XII are grossly intact. Proprioception is intact. No peripheral adenopathy or edema is identified. No motor or sensory levels are noted. Crude visual fields are within normal range.  RADIOLOGY RESULTS: Mammograms reviewed compatible with above-stated  findings  PLAN: Present time patient is doing well 1 year out with no evidence of disease.  And pleased with her overall progress.  I have asked to see her back in 1 year for follow-up.  Mammograms were ordered and scheduled.  Patient is to call with any concerns.  I would like to take this opportunity to thank you for allowing me to participate in the care of your patient.Noreene Filbert, MD

## 2022-05-09 ENCOUNTER — Encounter: Payer: Self-pay | Admitting: Family Medicine

## 2022-05-19 ENCOUNTER — Ambulatory Visit (INDEPENDENT_AMBULATORY_CARE_PROVIDER_SITE_OTHER): Payer: 59 | Admitting: Family Medicine

## 2022-05-19 ENCOUNTER — Encounter: Payer: Self-pay | Admitting: Family Medicine

## 2022-05-19 VITALS — BP 137/91 | HR 81 | Temp 98.2°F | Resp 16 | Ht 64.0 in | Wt 131.6 lb

## 2022-05-19 DIAGNOSIS — F339 Major depressive disorder, recurrent, unspecified: Secondary | ICD-10-CM | POA: Diagnosis not present

## 2022-05-19 DIAGNOSIS — R030 Elevated blood-pressure reading, without diagnosis of hypertension: Secondary | ICD-10-CM

## 2022-05-19 MED ORDER — ARIPIPRAZOLE 2 MG PO TABS: 2.0000 mg | ORAL_TABLET | Freq: Every day | ORAL | 0 refills | Status: DC

## 2022-05-19 NOTE — Progress Notes (Signed)
Established patient visit  I,[name],acting as a scribe for [employer], MD.,have documented all relevant documentation on the behalf of [name], MD,as directed by  [name], MD while in the presence of [name], MD.   Patient: Ebony Steele   DOB: 02-09-1976   46 y.o. Female  MRN: [id number] Visit Date: 05/19/2022  Today's healthcare provider: [name], MD   Chief Complaint  Patient presents with   Hypertension   Subjective    HPI  Hypertension: Patient is here for evaluation of elevated blood pressures.Cardiac symptoms  dizziness and light headedness , feels that is coming from the Wellbutrin.Patient denies chest pain, chest pressure/discomfort, irregular heart beat, and lower extremity edema.  Cardiovascular risk factors: none. BP pressure readings at home have been in the 140/98's-150/100's . Reports she stopped taking the '400mg'$  of Wellbutrin and is now taking 300 mg.Reports that on the 300 mg her  BP's reading have been in 130/80's-90's  She has previous tried paxil, lexapro, and had positively good results with wellbutrin before. She is trying to avoid weight gain as a SE. She would like to avoid SSRIs.   BP Readings from Last 3 Encounters:  05/19/22 (!) 137/91  04/30/22 (!) 133/98  12/02/21 129/88     Wt Readings from Last 3 Encounters:  05/19/22 131 lb 9.6 oz (59.7 kg)  04/30/22 129 lb 8 oz (58.7 kg)  12/02/21 137 lb 8 oz (62.4 kg)     Medications: Outpatient Medications Prior to Visit  Medication Sig   albuterol (VENTOLIN HFA) 108 (90 Base) MCG/ACT inhaler Inhale 1-2 puffs into the lungs every 6 (six) hours as needed for wheezing or shortness of breath.   ASHWAGANDHA PO Take 1 tablet by mouth daily. Goli Gummies   buPROPion (WELLBUTRIN XL) 150 MG 24 hr tablet Take 1 tablet (150 mg total) by mouth daily. Take with 300 mg   buPROPion (WELLBUTRIN XL) 300 MG 24 hr tablet Take 1  tablet (300 mg total) by mouth daily. Take with 150 mg   ibuprofen (ADVIL) 200 MG tablet Take 200-400 mg by mouth every 6 (six) hours as needed for moderate pain.   levothyroxine (SYNTHROID) 88 MCG tablet Take 1 tablet (88 mcg total) by mouth daily.   omeprazole (PRILOSEC) 40 MG capsule Take 1 capsule (40 mg total) by mouth daily.   polyethylene glycol (MIRALAX / GLYCOLAX) 17 g packet Take 17 g by mouth daily as needed.   traZODone (DESYREL) 50 MG tablet Take 1-2 tablets (50-100 mg total) by mouth at bedtime as needed for sleep. (Patient taking differently: Take 50 mg by mouth at bedtime.)   No facility-administered medications prior to visit.    Review of Systems     Objective    BP (!) 137/91 (BP Location: Left Arm, Patient Position: Sitting, Cuff Size: Normal)   Pulse 81   Temp 98.2 F (36.8 C) (Oral)   Resp 16   Ht '5\' 4"'$  (1.626 m)   Wt 131 lb 9.6 oz (59.7 kg)   BMI 22.59 kg/m    Physical Exam Constitutional:      Appearance: Normal appearance. She is normal weight. She is not ill-appearing.  Pulmonary:     Effort: Pulmonary effort is normal.  Neurological:     Mental Status: She is alert.  Psychiatric:        Attention and Perception: Attention normal.        Mood and Affect: Mood is depressed. Affect is  tearful.        Speech: Speech normal.        Behavior: Behavior normal. Behavior is cooperative.        Thought Content: Thought content normal.      No results found for any visits on 05/19/22.   Assessment & Plan     Problem List Items Addressed This Visit       Other   Elevated blood pressure reading    Acute  Suspect this is SE of wellbutrin and increased doses  Will work towards decreasing wellbutrin dose and monitor BP  Recommended patient check BP at home  F/u in 2 week f/u       Depression, recurrent ([place]) - Primary    Chronic  Symptoms reoccurring w/ decrease in medication dose  Will have patient take '300mg'$  wellbutrin instead of '400mg'$  as she  noticed increased BP at '400mg'$  dose  Will add abilify as patient has more depression symptoms at '300mg'$  dose  Patient will follow up in 2 weeks for mood check in setting of medication change  RX abilify '2mg'$  daily  Also discussed recommendation for patient to re-establish with therapist using [email or web address], she is agreeable with this plan         Return in about 2 weeks (around 06/02/2022) for mood .     I, [name], MD, have reviewed all documentation for this visit. The documentation on 05/19/22 for the exam, diagnosis, procedures, and orders are all accurate and complete.    [name], MD  [facility] [phone] (phone) [phone] (fax)  [place]

## 2022-05-19 NOTE — Assessment & Plan Note (Signed)
Chronic  Symptoms reoccurring w/ decrease in medication dose  Will have patient take '300mg'$  wellbutrin instead of '400mg'$  as she noticed increased BP at '400mg'$  dose  Will add abilify as patient has more depression symptoms at '300mg'$  dose  Patient will follow up in 2 weeks for mood check in setting of medication change  RX abilify '2mg'$  daily  Also discussed recommendation for patient to re-establish with therapist using [email or web address], she is agreeable with this plan

## 2022-05-19 NOTE — Assessment & Plan Note (Signed)
Acute  Suspect this is SE of wellbutrin and increased doses  Will work towards decreasing wellbutrin dose and monitor BP  Recommended patient check BP at home  F/u in 2 week f/u

## 2022-05-19 NOTE — Patient Instructions (Signed)
Please use [email or web address] to search for a therapist  Please check your blood pressure in the AM before taking your Wellbutrin and then checking one hour after your Wellbutrin. Please keep track of this for our next visit.   Please take the '300mg'$  dose of Wellbutrin and I will send a prescription for the '2mg'$  dose of Abilify to add to your regimen.   Please plan to follow up with me in 2 weeks to see how you are adjusting to the medication changes.

## 2022-06-02 ENCOUNTER — Encounter: Payer: Self-pay | Admitting: Family Medicine

## 2022-06-02 ENCOUNTER — Ambulatory Visit: Payer: 59 | Admitting: Family Medicine

## 2022-06-02 VITALS — BP 137/88 | HR 89 | Temp 98.3°F | Resp 16 | Wt 129.4 lb

## 2022-06-02 DIAGNOSIS — R030 Elevated blood-pressure reading, without diagnosis of hypertension: Secondary | ICD-10-CM

## 2022-06-02 DIAGNOSIS — F4321 Adjustment disorder with depressed mood: Secondary | ICD-10-CM

## 2022-06-02 MED ORDER — BUPROPION HCL ER (XL) 150 MG PO TB24: 150.0000 mg | ORAL_TABLET | ORAL | Status: DC

## 2022-06-02 MED ORDER — ARIPIPRAZOLE 5 MG PO TABS: 5.0000 mg | ORAL_TABLET | Freq: Every day | ORAL | 0 refills | Status: DC

## 2022-06-02 MED ORDER — BUSPIRONE HCL 5 MG PO TABS: 5.0000 mg | ORAL_TABLET | Freq: Three times a day (TID) | ORAL | 0 refills | Status: DC | PRN

## 2022-06-02 NOTE — Assessment & Plan Note (Signed)
Chronic uncontrolled Patient reports getting cancer diagnosis for her family member and a close friend and has since had difficulty with decreased energy and staying in bed most of the day for most of the last week Discussed reestablishing with therapist, she states that she has not been able to do this as of yet and would like to reestablish with a therapist as she states this is helped her in the past Patient is agreeable to increasing Abilify to 5 mg daily from 2 mg daily She will continue her taper of Wellbutrin as it is continue to cause her to have lightheadedness and dizziness and elevated blood pressure She will start 150 mg of Wellbutrin every other day since she has already been taking 300 mg every other day We will also try BuSpar 5 mg 3 times daily as needed She is scheduled for follow-up in 1 week on 06/10/2022

## 2022-06-02 NOTE — Progress Notes (Signed)
Established patient visit  I,Ebony Steele,acting as a scribe for [employer], MD.,have documented all relevant documentation on the behalf of Ebony Foster, MD,as directed by  Ebony Foster, MD while in the presence of Ebony Foster, MD.   Patient: Ebony Steele   DOB: 1976/07/11   46 y.o. Female  MRN: [id number] Visit Date: 06/02/2022  Today's healthcare provider: [name], MD   Chief Complaint  Patient presents with   Follow-up Depression and HTN   Subjective    HPI  Hypertension: Patient here for follow-up of elevated blood pressure. BP at that visit was 137/91. Management since that visit includes Will work towards decreasing wellbutrin dose and monitor BP. Per patient still feeling dizzy. She has been taking Wellbutrin every other day for the past week. BP Readings from Last 3 Encounters:  06/02/22 137/88  05/19/22 (!) 137/91  04/30/22 (!) 133/98    Depression, Follow-up  She  was last seen for this 2 weeks ago. Changes made at last visit include Will have patient take '300mg'$  wellbutrin instead of '400mg'$ .RX abilify '2mg'$  daily.to re-establish with therapist using [email or web address]   She reports excellent compliance with treatment. She is not having side effects.      06/02/2022    4:10 PM 04/21/2022   10:59 AM 03/11/2022    9:25 AM  Depression screen PHQ 2/9  Decreased Interest '2 2 3  '$ Down, Depressed, Hopeless '2 2 3  '$ PHQ - 2 Score '4 4 6  '$ Altered sleeping '2 1 3  '$ Tired, decreased energy '1 2 1  '$ Change in appetite '3 3 2  '$ Feeling bad or failure about yourself  1 1 0  Trouble concentrating '2 2 3  '$ Moving slowly or fidgety/restless 0 0 0  Suicidal thoughts 0 0 0  PHQ-9 Score '13 13 15  '$ Difficult doing work/chores Somewhat difficult Somewhat difficult     -----------------------------------------------------------------------------------------  Medications: Outpatient Medications Prior to Visit   Medication Sig   albuterol (VENTOLIN HFA) 108 (90 Base) MCG/ACT inhaler Inhale 1-2 puffs into the lungs every 6 (six) hours as needed for wheezing or shortness of breath.   ASHWAGANDHA PO Take 1 tablet by mouth daily. Goli Gummies   ibuprofen (ADVIL) 200 MG tablet Take 200-400 mg by mouth every 6 (six) hours as needed for moderate pain.   levothyroxine (SYNTHROID) 88 MCG tablet Take 1 tablet (88 mcg total) by mouth daily.   omeprazole (PRILOSEC) 40 MG capsule Take 1 capsule (40 mg total) by mouth daily.   polyethylene glycol (MIRALAX / GLYCOLAX) 17 g packet Take 17 g by mouth daily as needed.   traZODone (DESYREL) 50 MG tablet Take 1-2 tablets (50-100 mg total) by mouth at bedtime as needed for sleep. (Patient taking differently: Take 50 mg by mouth at bedtime.)   [DISCONTINUED] ARIPiprazole (ABILIFY) 2 MG tablet Take 1 tablet (2 mg total) by mouth daily.   [DISCONTINUED] buPROPion (WELLBUTRIN XL) 150 MG 24 hr tablet Take 1 tablet (150 mg total) by mouth daily. Take with 300 mg   [DISCONTINUED] buPROPion (WELLBUTRIN XL) 300 MG 24 hr tablet Take 1 tablet (300 mg total) by mouth daily. Take with 150 mg   No facility-administered medications prior to visit.    Review of Systems     Objective    BP 137/88 (BP Location: Left Arm, Patient Position: Sitting, Cuff Size: Normal)   Pulse 89   Temp 98.3 F (36.8 C) (Oral)   Resp 16   Wt 129 lb  6.4 oz (58.7 kg)   BMI 22.21 kg/m    Physical Exam Vitals reviewed.  Constitutional:      General: She is not in acute distress.    Appearance: Normal appearance. She is not ill-appearing, toxic-appearing or diaphoretic.  Eyes:     Conjunctiva/sclera: Conjunctivae normal.  Cardiovascular:     Rate and Rhythm: Normal rate and regular rhythm.     Pulses: Normal pulses.     Heart sounds: Normal heart sounds. No murmur heard.    No friction rub. No gallop.  Pulmonary:     Effort: Pulmonary effort is normal. No respiratory distress.     Breath  sounds: Normal breath sounds. No stridor. No wheezing, rhonchi or rales.  Neurological:     Mental Status: She is alert and oriented to person, place, and time.  Psychiatric:        Attention and Perception: Attention normal.        Mood and Affect: Affect is tearful.        Speech: Speech normal. Speech is not rapid and pressured or slurred.        Behavior: Behavior normal.        Thought Content: Thought content normal. Thought content is not paranoid or delusional. Thought content does not include homicidal or suicidal ideation. Thought content does not include homicidal or suicidal plan.        Cognition and Memory: Cognition normal.       No results found for any visits on 06/02/22.  Assessment & Plan     Problem List Items Addressed This Visit       Other   Elevated blood pressure reading    Blood pressure improved from last visit Suspect that this is indeed related to side effects of Wellbutrin We will continue to taper from 300 mg to 150 mg every other day She will follow-up in 1 week and continue to take home blood pressure measurements      Complicated grief - Primary    Chronic uncontrolled Patient reports getting cancer diagnosis for her family member and a close friend and has since had difficulty with decreased energy and staying in bed most of the day for most of the last week Discussed reestablishing with therapist, she states that she has not been able to do this as of yet and would like to reestablish with a therapist as she states this is helped her in the past Patient is agreeable to increasing Abilify to 5 mg daily from 2 mg daily She will continue her taper of Wellbutrin as it is continue to cause her to have lightheadedness and dizziness and elevated blood pressure She will start 150 mg of Wellbutrin every other day since she has already been taking 300 mg every other day We will also try BuSpar 5 mg 3 times daily as needed She is scheduled for follow-up  in 1 week on 06/10/2022      Relevant Medications   buPROPion (WELLBUTRIN XL) 150 MG 24 hr tablet     Return in about 1 week (around 06/09/2022) for Depression.      Cain Sieve Simmons-Robinson MD, have reviewed all documentation for this visit. The documentation for the exam, diagnosis, procedures, and orders are all accurate and complete.    Ebony Foster, MD  [facility] [phone] (phone) [phone] (fax)  [place]

## 2022-06-02 NOTE — Patient Instructions (Signed)
Today we made the following changes to your medications:   You will start taking '150mg'$  of Welbutrin every of day You can start taking Buspar '5mg'$  up to three times daily as needed  You will start taking Abilify '5mg'$  daily instead of the 2 mg daily  You will work on setting up with a therapist for talk therapy   Follow up is scheduled in 1 week

## 2022-06-02 NOTE — Assessment & Plan Note (Signed)
Blood pressure improved from last visit Suspect that this is indeed related to side effects of Wellbutrin We will continue to taper from 300 mg to 150 mg every other day She will follow-up in 1 week and continue to take home blood pressure measurements

## 2022-06-10 ENCOUNTER — Encounter: Payer: Self-pay | Admitting: Family Medicine

## 2022-06-10 ENCOUNTER — Ambulatory Visit (INDEPENDENT_AMBULATORY_CARE_PROVIDER_SITE_OTHER): Payer: 59 | Admitting: Family Medicine

## 2022-06-10 VITALS — BP 138/83 | HR 85 | Temp 98.1°F | Resp 16 | Wt 132.9 lb

## 2022-06-10 DIAGNOSIS — F339 Major depressive disorder, recurrent, unspecified: Secondary | ICD-10-CM | POA: Diagnosis not present

## 2022-06-10 DIAGNOSIS — R030 Elevated blood-pressure reading, without diagnosis of hypertension: Secondary | ICD-10-CM | POA: Diagnosis not present

## 2022-06-10 NOTE — Progress Notes (Signed)
I,Ebony Steele,acting as a scribe for [employer], MD.,have documented all relevant documentation on the behalf of Ebony Foster, MD,as directed by  Ebony Foster, MD while in the presence of Ebony Foster, MD.   Established patient visit   Patient: Ebony Steele   DOB: September 16, 1975   46 y.o. Female  MRN: [id number] Visit Date: 06/10/2022  Today's healthcare provider: [name], MD   Chief Complaint  Patient presents with   follow-up HTN   Subjective    HPI  Hypertension: Patient here for 1 week follow-up of elevated blood pressure.  Management since that visit includes will continue to taper from 300 mg to 150 mg every other day. BP at that visit was 137/88. Reports no more dizzy spells. Just been feeling sleepy around noon time. She reports home BP measurements ranging from 120-low 130s/70-80s.  BP Readings from Last 3 Encounters:  06/10/22 138/83  06/02/22 137/88  05/19/22 (!) 137/91   Wt Readings from Last 3 Encounters:  06/10/22 132 lb 14.4 oz (60.3 kg)  06/02/22 129 lb 6.4 oz (58.7 kg)  05/19/22 131 lb 9.6 oz (59.7 kg)    Depression  Patient reports that since being on Abilify 5 mg, she is feeling much better regards to her mood She describes her mood is "chill" She denies suicidal ideation She has continued her taper of Wellbutrin and is now taking 150 mg every other day She has not established with a therapist yet   Medications: Outpatient Medications Prior to Visit  Medication Sig   albuterol (VENTOLIN HFA) 108 (90 Base) MCG/ACT inhaler Inhale 1-2 puffs into the lungs every 6 (six) hours as needed for wheezing or shortness of breath.   ARIPiprazole (ABILIFY) 5 MG tablet Take 1 tablet (5 mg total) by mouth daily.   ASHWAGANDHA PO Take 1 tablet by mouth daily. Goli Gummies   buPROPion (WELLBUTRIN XL) 150 MG 24 hr tablet Take 1 tablet (150 mg total) by mouth every other day.   busPIRone (BUSPAR) 5  MG tablet Take 1 tablet (5 mg total) by mouth 3 (three) times daily as needed.   ibuprofen (ADVIL) 200 MG tablet Take 200-400 mg by mouth every 6 (six) hours as needed for moderate pain.   levothyroxine (SYNTHROID) 88 MCG tablet Take 1 tablet (88 mcg total) by mouth daily.   omeprazole (PRILOSEC) 40 MG capsule Take 1 capsule (40 mg total) by mouth daily.   polyethylene glycol (MIRALAX / GLYCOLAX) 17 g packet Take 17 g by mouth daily as needed.   traZODone (DESYREL) 50 MG tablet Take 1-2 tablets (50-100 mg total) by mouth at bedtime as needed for sleep. (Patient taking differently: Take 50 mg by mouth at bedtime.)   No facility-administered medications prior to visit.    Review of Systems     Objective    BP 138/83 (BP Location: Right Arm, Patient Position: Sitting, Cuff Size: Normal)   Pulse 85   Temp 98.1 F (36.7 C) (Oral)   Resp 16   Wt 132 lb 14.4 oz (60.3 kg)   BMI 22.81 kg/m    Physical Exam Vitals reviewed.  Constitutional:      General: She is not in acute distress.    Appearance: Normal appearance. She is not ill-appearing, toxic-appearing or diaphoretic.  Eyes:     Conjunctiva/sclera: Conjunctivae normal.  Cardiovascular:     Rate and Rhythm: Normal rate and regular rhythm.     Pulses: Normal pulses.     Heart sounds:  Normal heart sounds. No murmur heard.    No friction rub. No gallop.  Pulmonary:     Effort: Pulmonary effort is normal. No respiratory distress.     Breath sounds: Normal breath sounds. No stridor. No wheezing, rhonchi or rales.  Neurological:     Mental Status: She is alert and oriented to person, place, and time.  Psychiatric:        Attention and Perception: Attention and perception normal. She is attentive.        Mood and Affect: Mood normal. Affect is tearful.        Speech: Speech normal.        Behavior: Behavior normal. Behavior is cooperative.        Thought Content: Thought content normal.       No results found for any visits on  06/10/22.  Assessment & Plan     Problem List Items Addressed This Visit       Other   Elevated blood pressure reading    BP is improved  She will continue to monitor home BP readings and plan for follow up in 1 month for BP check once she has completed stopped the wellbutrin        Depression, recurrent ([place]) - Primary    Chronic, improved  She will continue abilify '5mg'$  daily  She will complete wellbutrin taper this week and DC this medication  She will continue to work on establishing with a therapist  F/u in 1 month          Return in about 1 month (around 07/11/2022) for depression .      Cain Sieve Simmons-Robinson MD, have reviewed all documentation for this visit. The documentation for the exam, diagnosis, procedures, and orders are all accurate and complete.   Ebony Foster, MD  [facility] [phone] (phone) [phone] (fax)  [place]

## 2022-06-10 NOTE — Assessment & Plan Note (Signed)
Chronic, improved  She will continue abilify '5mg'$  daily  She will complete wellbutrin taper this week and DC this medication  She will continue to work on establishing with a therapist  F/u in 1 month

## 2022-06-10 NOTE — Assessment & Plan Note (Signed)
BP is improved  She will continue to monitor home BP readings and plan for follow up in 1 month for BP check once she has completed stopped the wellbutrin

## 2022-06-25 ENCOUNTER — Other Ambulatory Visit: Payer: Self-pay | Admitting: Family Medicine

## 2022-08-03 ENCOUNTER — Other Ambulatory Visit: Payer: Self-pay | Admitting: Family Medicine

## 2022-08-11 ENCOUNTER — Ambulatory Visit (INDEPENDENT_AMBULATORY_CARE_PROVIDER_SITE_OTHER): Payer: 59 | Admitting: Family Medicine

## 2022-08-11 ENCOUNTER — Encounter: Payer: Self-pay | Admitting: Family Medicine

## 2022-08-11 VITALS — BP 136/85 | HR 79 | Temp 98.3°F | Resp 16 | Ht 64.0 in | Wt 132.2 lb

## 2022-08-11 DIAGNOSIS — Z0000 Encounter for general adult medical examination without abnormal findings: Secondary | ICD-10-CM

## 2022-08-11 DIAGNOSIS — F339 Major depressive disorder, recurrent, unspecified: Secondary | ICD-10-CM

## 2022-08-11 DIAGNOSIS — Z23 Encounter for immunization: Secondary | ICD-10-CM | POA: Diagnosis not present

## 2022-08-11 DIAGNOSIS — E039 Hypothyroidism, unspecified: Secondary | ICD-10-CM | POA: Diagnosis not present

## 2022-08-11 DIAGNOSIS — Z1211 Encounter for screening for malignant neoplasm of colon: Secondary | ICD-10-CM

## 2022-08-11 DIAGNOSIS — F411 Generalized anxiety disorder: Secondary | ICD-10-CM

## 2022-08-11 DIAGNOSIS — F4321 Adjustment disorder with depressed mood: Secondary | ICD-10-CM | POA: Diagnosis not present

## 2022-08-11 MED ORDER — BUPROPION HCL ER (XL) 150 MG PO TB24: 150.0000 mg | ORAL_TABLET | ORAL | 1 refills | Status: AC

## 2022-08-11 NOTE — Assessment & Plan Note (Addendum)
Previously well controlled Continue Synthroid at current dose - not taking consistently Recheck TSH and adjust Synthroid as indicated

## 2022-08-11 NOTE — Progress Notes (Signed)
I,Sulibeya S Dimas,acting as a Education administrator for Lavon Paganini, MD.,have documented all relevant documentation on the behalf of Lavon Paganini, MD,as directed by  Lavon Paganini, MD while in the presence of Lavon Paganini, MD.    Complete physical exam   Patient: Ebony Steele   DOB: 12-20-1975   46 y.o. Female  MRN: [id number] Visit Date: 08/11/2022  Today's healthcare provider: Lavon Paganini, MD   Chief Complaint  Patient presents with   Annual Exam   Subjective    Ebony Steele is a 46 y.o. female who presents today for a complete physical exam.  She reports consuming a general diet. The patient does not participate in regular exercise at present. She generally feels fairly well. She reports sleeping poorly. She does not have additional problems to discuss today.  HPI  Patient declined to update Tdap vaccine.   Depression, anxiety worse, more tearful. Stressful time, grieving her mom's death.  Had stopped Wellbutrin due to elevated BP.  Denies family history of colon cancer  Past Medical History:  Diagnosis Date   Asthma    Breast cancer ([place])    BV (bacterial vaginosis)    Family history of colon cancer    GERD (gastroesophageal reflux disease)    Menometrorrhagia    Thyroid disease    Past Surgical History:  Procedure Laterality Date   BREAST BIOPSY Right 12/19/2020   stereo bx/ x clip/  DUCTAL CARCINOMA IN SITU   BREAST LUMPECTOMY     BREAST LUMPECTOMY Right 01/09/2021   DUCTAL CARCINOMA IN SITU   DILATION AND CURETTAGE OF UTERUS  2005   LAPAROSCOPY  2000   OOPHORECTOMY Right 2000   Tertoma   TUBAL LIGATION     Social History   Socioeconomic History   Marital status: Married    Spouse name: Not on file   Number of children: 3   Years of education: College   Highest education level: Not on file  Occupational History   Occupation: [employer]    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  She stopped  the end of May 2018   Sexual activity: Yes  Other Topics Concern   Not on file  Social History Narrative   Not on file   Social Determinants of Health   Financial Resource Strain: Low Risk  (03/11/2022)   Overall Financial Resource Strain (CARDIA)    Difficulty of Paying Living Expenses: Not hard at all  Food Insecurity: Not on file  Transportation Needs: Not on file  Physical Activity: Not on file  Stress: Not on file  Social Connections: Not on file  Intimate Partner Violence: Not on file   Family Status  Relation Name Status   Mother  Deceased   Father  Deceased   Brother  Alive   MGM  Deceased   MGF  Deceased   PGM  Deceased   PGF  Deceased   Daughter x3 Alive   Mat Aunt  Deceased   Family History  Problem Relation Age of Onset   Diabetes Mother    Depression Mother    Hypertension Mother  Glaucoma Mother    Kidney disease Father    Diabetes Father    Diabetes Mellitus I Brother    Cancer Maternal Aunt    Allergies  Allergen Reactions   Acetaminophen Anaphylaxis    Patient Care Team: Calix Heinbaugh, Dionne Bucy, MD as PCP - General (Family Medicine) Sindy Guadeloupe, MD as Consulting Physician (Oncology) Herbert Pun, MD as Consulting Physician (General Surgery) Noreene Filbert, MD as Consulting Physician (Radiation Oncology) Vern Claude, LCSW as Social Worker   Medications: Outpatient Medications Prior to Visit  Medication Sig   albuterol (VENTOLIN HFA) 108 (90 Base) MCG/ACT inhaler Inhale 1-2 puffs into the lungs every 6 (six) hours as needed for wheezing or shortness of breath.   ARIPiprazole (ABILIFY) 5 MG tablet Take 1 tablet by mouth once daily   busPIRone (BUSPAR) 5 MG  tablet Take 1 tablet by mouth three times daily as needed   ibuprofen (ADVIL) 200 MG tablet Take 200-400 mg by mouth every 6 (six) hours as needed for moderate pain.   levothyroxine (SYNTHROID) 88 MCG tablet Take 1 tablet (88 mcg total) by mouth daily.   omeprazole (PRILOSEC) 40 MG capsule Take 1 capsule (40 mg total) by mouth daily.   polyethylene glycol (MIRALAX / GLYCOLAX) 17 g packet Take 17 g by mouth daily as needed.   traZODone (DESYREL) 50 MG tablet Take 1-2 tablets (50-100 mg total) by mouth at bedtime as needed for sleep. (Patient taking differently: Take 50 mg by mouth at bedtime.)   [DISCONTINUED] ASHWAGANDHA PO Take 1 tablet by mouth daily. Goli Gummies   [DISCONTINUED] buPROPion (WELLBUTRIN XL) 150 MG 24 hr tablet Take 1 tablet (150 mg total) by mouth every other day.   No facility-administered medications prior to visit.    Review of Systems  Constitutional:        Crying, irritability  Psychiatric/Behavioral:  Positive for decreased concentration and sleep disturbance. The patient is nervous/anxious.   All other systems reviewed and are negative.   Last CBC Lab Results  Component Value Date   WBC 4.5 02/25/2021   HGB 12.3 02/25/2021   HCT 37.2 02/25/2021   MCV 88.6 02/25/2021   MCH 29.3 02/25/2021   RDW 13.5 02/25/2021   PLT 216 16/60/6301   Last metabolic panel Lab Results  Component Value Date   GLUCOSE 84 09/10/2020   NA 139 09/10/2020   K 4.1 09/10/2020   CL 107 (H) 09/10/2020   CO2 20 09/10/2020   BUN 10 09/10/2020   CREATININE 0.88 09/10/2020   GFRNONAA 80 09/10/2020   CALCIUM 9.2 09/10/2020   PROT 7.3 09/10/2020   ALBUMIN 4.5 09/10/2020   LABGLOB 2.8 09/10/2020   AGRATIO 1.6 09/10/2020   BILITOT 0.3 09/10/2020   ALKPHOS 75 09/10/2020   AST 16 09/10/2020   ALT 14 09/10/2020   Last lipids Lab Results  Component Value Date   CHOL 164 09/10/2020   HDL 60 09/10/2020   LDLCALC 90 09/10/2020   TRIG 74 09/10/2020   CHOLHDL 2.7 09/10/2020    Last hemoglobin A1c Lab Results  Component Value Date   HGBA1C 5.5 06/29/2018   Last thyroid functions Lab Results  Component Value Date   TSH 4.280 12/02/2021      Objective    BP 136/85 (BP Location: Left Arm, Patient Position: Sitting, Cuff Size: Normal)   Pulse 79   Temp 98.3 F (36.8 C) (Oral)   Resp 16   Ht '5\' 4"'$  (1.626 m)   Wt 132 lb 3.2  oz (60 kg)   LMP 08/07/2022 (Exact Date)   BMI 22.69 kg/m  BP Readings from Last 3 Encounters:  08/11/22 136/85  06/10/22 138/83  06/02/22 137/88   Wt Readings from Last 3 Encounters:  08/11/22 132 lb 3.2 oz (60 kg)  06/10/22 132 lb 14.4 oz (60.3 kg)  06/02/22 129 lb 6.4 oz (58.7 kg)      Physical Exam Vitals reviewed.  Constitutional:      General: She is not in acute distress.    Appearance: Normal appearance. She is well-developed. She is not diaphoretic.  HENT:     Head: Normocephalic and atraumatic.     Right Ear: Tympanic membrane, ear canal and external ear normal.     Left Ear: Tympanic membrane, ear canal and external ear normal.     Nose: Nose normal.     Mouth/Throat:     Mouth: Mucous membranes are moist.     Pharynx: Oropharynx is clear. No oropharyngeal exudate.  Eyes:     General: No scleral icterus.    Conjunctiva/sclera: Conjunctivae normal.     Pupils: Pupils are equal, round, and reactive to light.  Neck:     Thyroid: No thyromegaly.  Cardiovascular:     Rate and Rhythm: Normal rate and regular rhythm.     Pulses: Normal pulses.     Heart sounds: Normal heart sounds. No murmur heard. Pulmonary:     Effort: Pulmonary effort is normal. No respiratory distress.     Breath sounds: Normal breath sounds. No wheezing or rales.  Abdominal:     General: There is no distension.     Palpations: Abdomen is soft.     Tenderness: There is no abdominal tenderness.  Musculoskeletal:        General: No deformity.     Cervical back: Neck supple.     Right lower leg: No edema.     Left lower leg: No  edema.  Lymphadenopathy:     Cervical: No cervical adenopathy.  Skin:    General: Skin is warm and dry.     Findings: No rash.  Neurological:     Mental Status: She is alert and oriented to person, place, and time. Mental status is at baseline.     Sensory: No sensory deficit.     Motor: No weakness.     Gait: Gait normal.  Psychiatric:        Mood and Affect: Mood normal.        Behavior: Behavior normal.        Thought Content: Thought content normal.       Last depression screening scores    06/02/2022    4:10 PM 04/21/2022   10:59 AM 03/11/2022    9:25 AM  PHQ 2/9 Scores  PHQ - 2 Score '4 4 6  '$ PHQ- 9 Score '13 13 15   '$ Last fall risk screening    09/10/2020    9:16 AM  Marine in the past year? 0  Number falls in past yr: 0  Injury with Fall? 0   Last Audit-C alcohol use screening    03/11/2022    9:32 AM  Alcohol Use Disorder Test (AUDIT)  1. How often do you have a drink containing alcohol? 1  2. How many drinks containing alcohol do you have on a typical day when you are drinking? 0  3. How often do you have six or more drinks on one occasion? 0  AUDIT-C Score  1   A score of 3 or more in women, and 4 or more in men indicates increased risk for alcohol abuse, EXCEPT if all of the points are from question 1   No results found for any visits on 08/11/22.  Assessment & Plan    Routine Health Maintenance and Physical Exam  Exercise Activities and Dietary recommendations  Goals       Exercise 150 minutes per week (moderate activity)      Grief Counseling (pt-stated)      Care Coordination Interventions:   Follow up regarding referral to grief counseling Confirmed that patient has contacted Hospice and Palliative Care, she missed their call yesterday and is agreeable to calling them back to schedule the initial appointment Active listening / Reflection utilized  Continued to provide emotional Support           Immunization History   Administered Date(s) Administered   Influenza,inj,Quad PF,6+ Mos 08/11/2022   Influenza-Unspecified 06/30/2018   Moderna Sars-Covid-2 Vaccination 09/12/2019, 10/10/2019, 08/16/2020   PPD Test 07/16/2021   Tdap 01/28/2012    Health Maintenance  Topic Date Due   Fecal DNA (Cologuard)  Never done   DTaP/Tdap/Td (2 - Td or Tdap) 01/27/2022   COVID-19 Vaccine (4 - 2023-24 season) 05/02/2022   PAP SMEAR-Modifier  02/25/2026   INFLUENZA VACCINE  Completed   Hepatitis C Screening  Completed   HIV Screening  Completed   HPV VACCINES  Aged Out    Discussed health benefits of physical activity, and encouraged her to engage in regular exercise appropriate for her age and condition.  Problem List Items Addressed This Visit       Endocrine   Hypothyroidism    Previously well controlled Continue Synthroid at current dose - not taking consistently Recheck TSH and adjust Synthroid as indicated        Relevant Orders   TSH     Other   GAD (generalized anxiety disorder)    Chronic and uncontrolled Continue buspar and abilify Resume wellbutrin XL 150 mg daily      Relevant Medications   buPROPion (WELLBUTRIN XL) 150 MG 24 hr tablet   Complicated grief   Relevant Medications   buPROPion (WELLBUTRIN XL) 150 MG 24 hr tablet   Depression, recurrent ([facility])    Chronic and uncontrolled Will resume wellbutrin XL '150mg'$  daily Continue abilify Contracted for safety - no SI/HI Discussed synergistic effects of medications and therapy       Relevant Medications   buPROPion (WELLBUTRIN XL) 150 MG 24 hr tablet   Other Visit Diagnoses     Encounter for annual physical exam    -  Primary   Relevant Orders   Comprehensive metabolic panel   Lipid panel   TSH   CBC   Colon cancer screening       Relevant Orders   Cologuard   Need for influenza vaccination       Relevant Orders   Flu Vaccine QUAD 16moIM (Fluarix, Fluzone & Alfiuria Quad PF) (Completed)        Return in about 2  months (around 10/12/2022) for MDD/GAD f/u, virtual ok.     I, ALavon Paganini MD, have reviewed all documentation for this visit. The documentation on 08/11/22 for the exam, diagnosis, procedures, and orders are all accurate and complete.   Fern Asmar, ADionne Bucy MD, MPH [facility]

## 2022-08-11 NOTE — Assessment & Plan Note (Signed)
Chronic and uncontrolled Continue buspar and abilify Resume wellbutrin XL 150 mg daily

## 2022-08-11 NOTE — Assessment & Plan Note (Signed)
Chronic and uncontrolled Will resume wellbutrin XL '150mg'$  daily Continue abilify Contracted for safety - no SI/HI Discussed synergistic effects of medications and therapy

## 2022-08-12 LAB — COMPREHENSIVE METABOLIC PANEL
ALT: 10 IU/L (ref 0–32)
AST: 16 IU/L (ref 0–40)
Albumin/Globulin Ratio: 1.9 (ref 1.2–2.2)
Albumin: 4.7 g/dL (ref 3.9–4.9)
Alkaline Phosphatase: 65 IU/L (ref 44–121)
BUN/Creatinine Ratio: 12 (ref 9–23)
BUN: 10 mg/dL (ref 6–24)
Bilirubin Total: 0.3 mg/dL (ref 0.0–1.2)
CO2: 21 mmol/L (ref 20–29)
Calcium: 9.1 mg/dL (ref 8.7–10.2)
Chloride: 106 mmol/L (ref 96–106)
Creatinine, Ser: 0.86 mg/dL (ref 0.57–1.00)
Globulin, Total: 2.5 g/dL (ref 1.5–4.5)
Glucose: 88 mg/dL (ref 70–99)
Potassium: 3.7 mmol/L (ref 3.5–5.2)
Sodium: 139 mmol/L (ref 134–144)
Total Protein: 7.2 g/dL (ref 6.0–8.5)
eGFR: 84 mL/min/{1.73_m2} (ref >59)

## 2022-08-12 LAB — CBC
Hematocrit: 37.8 % (ref 34.0–46.6)
Hemoglobin: 12.5 g/dL (ref 11.1–15.9)
MCH: 28.9 pg (ref 26.6–33.0)
MCHC: 33.1 g/dL (ref 31.5–35.7)
MCV: 87 fL (ref 79–97)
Platelets: 240 10*3/uL (ref 150–450)
RBC: 4.33 x10E6/uL (ref 3.77–5.28)
RDW: 12.2 % (ref 11.7–15.4)
WBC: 3.7 10*3/uL (ref 3.4–10.8)

## 2022-08-12 LAB — LIPID PANEL
Chol/HDL Ratio: 2.3 ratio (ref 0.0–4.4)
Cholesterol, Total: 159 mg/dL (ref 100–199)
HDL: 68 mg/dL (ref >39)
LDL Chol Calc (NIH): 78 mg/dL (ref 0–99)
Triglycerides: 66 mg/dL (ref 0–149)
VLDL Cholesterol Cal: 13 mg/dL (ref 5–40)

## 2022-08-12 LAB — TSH: TSH: 2.5 u[IU]/mL (ref 0.450–4.500)

## 2022-09-26 LAB — COLOGUARD: COLOGUARD: NEGATIVE

## 2022-10-13 ENCOUNTER — Telehealth: Payer: 59 | Admitting: Family Medicine

## 2022-10-31 ENCOUNTER — Encounter: Payer: Self-pay | Admitting: Oncology

## 2022-10-31 ENCOUNTER — Inpatient Hospital Stay: Payer: BC Managed Care – PPO | Attending: Oncology | Admitting: Oncology

## 2022-10-31 VITALS — BP 125/82 | HR 82 | Temp 97.4°F | Resp 18 | Ht 64.0 in | Wt 150.1 lb

## 2022-10-31 DIAGNOSIS — D0511 Intraductal carcinoma in situ of right breast: Secondary | ICD-10-CM | POA: Diagnosis not present

## 2022-10-31 DIAGNOSIS — Z79899 Other long term (current) drug therapy: Secondary | ICD-10-CM | POA: Diagnosis not present

## 2022-10-31 DIAGNOSIS — Z923 Personal history of irradiation: Secondary | ICD-10-CM | POA: Diagnosis not present

## 2022-10-31 DIAGNOSIS — Z08 Encounter for follow-up examination after completed treatment for malignant neoplasm: Secondary | ICD-10-CM

## 2022-10-31 DIAGNOSIS — Z87891 Personal history of nicotine dependence: Secondary | ICD-10-CM | POA: Diagnosis not present

## 2022-10-31 DIAGNOSIS — Z86000 Personal history of in-situ neoplasm of breast: Secondary | ICD-10-CM | POA: Diagnosis not present

## 2022-10-31 NOTE — Progress Notes (Signed)
Hematology/Oncology Consult note [facility]  [phone] [phone]  Patient Care Team: Virginia Crews, MD as PCP - General (Family Medicine) Sindy Guadeloupe, MD as Consulting Physician (Oncology) Herbert Pun, MD as Consulting Physician (General Surgery) Noreene Filbert, MD as Consulting Physician (Radiation Oncology) Vern Claude, [place] as Social Worker   Name of the patient: Ebony Steele  [id number]  [birth year]   Date of visit: 10/31/22  Diagnosis-right breast DCIS  Chief complaint/ Reason for visit-routine follow-up of right breast DCIS  Heme/Onc history: Patient is a 47 year old premenopausal African-American female found to have abnormal mammogram in May 2022.  This was biopsy-proven low-grade DCIS ER positive.  She underwent lumpectomy with Dr. Peyton Najjar in June 2022.  Final pathology showed 5 mm grade 1 low-grade DCIS with negative margins.  She underwent adjuvant radiation treatment.  She has decided not to pursue any adjuvant hormone therapy.  So far  Interval history- ***  ECOG PS- *** Pain scale- *** Opioid associated constipation- ***  Review of systems- ROS    Allergies  Allergen Reactions  . Acetaminophen Anaphylaxis     Past Medical History:  Diagnosis Date  . Asthma   . Breast cancer ([place])   . BV (bacterial vaginosis)   . Family history of colon cancer   . GERD (gastroesophageal reflux disease)   . Menometrorrhagia   . Thyroid disease      Past Surgical History:  Procedure Laterality Date  . BREAST BIOPSY Right 12/19/2020   stereo bx/ x clip/  DUCTAL CARCINOMA IN SITU  . BREAST LUMPECTOMY    . BREAST LUMPECTOMY Right 01/09/2021   DUCTAL CARCINOMA IN SITU  . DILATION AND CURETTAGE OF UTERUS  2005  . LAPAROSCOPY  2000  . OOPHORECTOMY Right 2000   Tertoma  . TUBAL LIGATION      Social History   Socioeconomic History  . Marital status: Married    Spouse name: Not on file  .  Number of children: 3  . Years of education: College  . Highest education level: Not on file  Occupational History  . 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 Smoking status: Former    Types: Cigars  . Smokeless tobacco: Never  Vaping Use  . Vaping Use: Never used  Substance and Sexual Activity  . Alcohol use: Yes    Comment: Occasional Wine  . Drug use: No    Types: Marijuana    Comment: Pt reports using Marijuana to help with depression symptoms.  She stopped  the end of May 2018  . Sexual activity: Yes  Other Topics Concern  . Not on file  Social History Narrative  . Not on file   Social Determinants of Health   Financial Resource Strain: Low Risk  (03/11/2022)   Overall Financial Resource Strain (CARDIA)   . Difficulty of Paying Living Expenses: Not hard at all  Food Insecurity: Not on file  Transportation Needs: Not on file  Physical Activity: Not on file  Stress: Not on file  Social Connections: Not on file  Intimate Partner Violence: Not on file    Family History  Problem Relation Age of Onset  . Diabetes Mother   . Depression Mother   . Hypertension Mother   . Glaucoma Mother   . Kidney disease Father   . Diabetes Father   . Diabetes Mellitus I Brother   . Cancer Maternal Aunt  Current Outpatient Medications:  .  albuterol (VENTOLIN HFA) 108 (90 Base) MCG/ACT inhaler, Inhale 1-2 puffs into the lungs every 6 (six) hours as needed for wheezing or shortness of breath., Disp: 1 each, Rfl: 2 .  ARIPiprazole (ABILIFY) 5 MG tablet, Take 1 tablet by mouth once daily, Disp: 90 tablet, Rfl: 1 .  buPROPion (WELLBUTRIN XL) 150 MG 24 hr tablet, Take 1 tablet (150 mg total) by mouth every other day., Disp: 90 tablet, Rfl: 1 .  buPROPion (WELLBUTRIN XL) 300 MG 24 hr tablet, Take by mouth., Disp: , Rfl:  .  busPIRone (BUSPAR) 5 MG tablet, Take 1 tablet by mouth three times daily as needed, Disp: 270 tablet, Rfl: 1 .  ibuprofen  (ADVIL) 200 MG tablet, Take 200-400 mg by mouth every 6 (six) hours as needed for moderate pain., Disp: , Rfl:  .  levothyroxine (SYNTHROID) 88 MCG tablet, Take 1 tablet (88 mcg total) by mouth daily., Disp: 90 tablet, Rfl: 3 .  omeprazole (PRILOSEC) 40 MG capsule, Take 1 capsule (40 mg total) by mouth daily., Disp: 90 capsule, Rfl: 3 .  polyethylene glycol (MIRALAX / GLYCOLAX) 17 g packet, Take 17 g by mouth daily as needed., Disp: , Rfl:  .  traZODone (DESYREL) 50 MG tablet, Take 1-2 tablets (50-100 mg total) by mouth at bedtime as needed for sleep. (Patient taking differently: Take 50 mg by mouth at bedtime.), Disp: 60 tablet, Rfl: 3  Physical exam:  Vitals:   10/31/22 1134  BP: 125/82  Pulse: 82  Resp: 18  Temp: (!) 97.4 F (36.3 C)  TempSrc: Tympanic  SpO2: 100%  Weight: 150 lb 1.6 oz (68.1 kg)  Height: '5\' 4"'$  (1.626 m)   Physical Exam      Latest Ref Rng & Units 08/11/2022    3:30 PM  CMP  Glucose 70 - 99 mg/dL 88   BUN 6 - 24 mg/dL 10   Creatinine 0.57 - 1.00 mg/dL 0.86   Sodium 134 - 144 mmol/L 139   Potassium 3.5 - 5.2 mmol/L 3.7   Chloride 96 - 106 mmol/L 106   CO2 20 - 29 mmol/L 21   Calcium 8.7 - 10.2 mg/dL 9.1   Total Protein 6.0 - 8.5 g/dL 7.2   Total Bilirubin 0.0 - 1.2 mg/dL 0.3   Alkaline Phos 44 - 121 IU/L 65   AST 0 - 40 IU/L 16   ALT 0 - 32 IU/L 10       Latest Ref Rng & Units 08/11/2022    3:30 PM  CBC  WBC 3.4 - 10.8 x10E3/uL 3.7   Hemoglobin 11.1 - 15.9 g/dL 12.5   Hematocrit 34.0 - 46.6 % 37.8   Platelets 150 - 450 x10E3/uL 240     No images are attached to the encounter.  No results found.   Assessment and plan- Patient is a 47 y.o. female ***   Visit Diagnosis No diagnosis found.   Dr. Randa Evens, MD, MPH [facility] [id number] 10/31/2022 11:37 AM

## 2022-12-01 ENCOUNTER — Other Ambulatory Visit: Payer: Self-pay | Admitting: General Surgery

## 2022-12-01 DIAGNOSIS — Z853 Personal history of malignant neoplasm of breast: Secondary | ICD-10-CM

## 2022-12-15 ENCOUNTER — Ambulatory Visit
Admission: RE | Admit: 2022-12-15 | Discharge: 2022-12-15 | Disposition: A | Discharge status: Home / Self Care | Payer: BLUE CROSS/BLUE SHIELD | Source: Ambulatory Visit | Attending: General Surgery | Admitting: General Surgery

## 2022-12-15 DIAGNOSIS — Z853 Personal history of malignant neoplasm of breast: Secondary | ICD-10-CM

## 2022-12-15 HISTORY — DX: Personal history of irradiation: Z92.3

## 2023-03-18 ENCOUNTER — Encounter: Payer: Self-pay | Admitting: Oncology

## 2023-04-24 ENCOUNTER — Ambulatory Visit: Payer: Self-pay | Admitting: General Surgery

## 2023-04-24 NOTE — H&P (View-Only) (Signed)
 HISTORY OF PRESENT ILLNESS:    Ebony Steele is a 47 y.o.female patient who comes for follow up right breast hematoma.  Patient with history of partial mastectomy on 01/09/2021.  She presented a month ago with a lump of the right breast on the incision area.  Patient had a history of right breast cancer and diagnostic mammogram was done on December 15, 2022.  This shows expected postoperative changes without any significant fluid collection.  Patient denies any trauma to the right breast.  I tried to aspirate the fluid last month but it was too thick consistent with chronic hematoma.  At this point have low suspicion of recurrent malignancy but she endorsed that the hematoma is not resolving by itself.  There is localized pain to the right breast.  No pain radiation.  Pain aggravated by applying pressure.  No aggravating factors.  Denies any fever.      PAST MEDICAL HISTORY:  Past Medical History:  Diagnosis Date   Anxiety    Constipation    Depression    Ductal carcinoma in situ (DCIS) of right breast    HTN (hypertension)    Hypothyroidism         PAST SURGICAL HISTORY:   Past Surgical History:  Procedure Laterality Date   MASTECTOMY PARTIAL Right 01/09/2021   Dr Arrie Senate -- w/ RF         MEDICATIONS:  Outpatient Encounter Medications as of 04/24/2023  Medication Sig Dispense Refill   albuterol 90 mcg/actuation inhaler Inhale 2 inhalations into the lungs every 4 (four) hours as needed for Wheezing. 1 Inhaler 0   ARIPiprazole (ABILIFY) 5 MG tablet Take 1 tablet by mouth once daily     buPROPion (WELLBUTRIN XL) 150 MG XL tablet Take 150 mg by mouth     busPIRone (BUSPAR) 5 MG tablet Take 1 tablet by mouth 3 (three) times daily as needed     levothyroxine (SYNTHROID) 88 MCG tablet Take 88 mcg by mouth once daily Take on an empty stomach with a glass of water at least 30-60 minutes before breakfast.     omeprazole (PRILOSEC) 40 MG DR capsule Take by mouth as needed     ondansetron  (ZOFRAN-ODT) 4 MG disintegrating tablet Take 1 tablet (4 mg total) by mouth every 8 (eight) hours as needed for Nausea. 20 tablet 0   traZODone (DESYREL) 50 MG tablet Take by mouth     benzonatate (TESSALON) 100 MG capsule Take by mouth (Patient not taking: Reported on 12/24/2020)     buPROPion (WELLBUTRIN XL) 300 MG XL tablet Take 1 tablet by mouth once daily (Patient not taking: Reported on 12/25/2022)     fluticasone (FLONASE) 50 mcg/actuation nasal spray Place 1 spray into both nostrils 2 (two) times daily. (Patient not taking: Reported on 12/09/2018) 16 g 0   No facility-administered encounter medications on file as of 04/24/2023.     ALLERGIES:   Tylenol [acetaminophen]   SOCIAL HISTORY:  Social History   Socioeconomic History   Marital status: Married  Tobacco Use   Smoking status: Former    Current packs/day: 0.00    Types: Cigarettes    Quit date: 12/20/2020    Years since quitting: 2.3   Smokeless tobacco: Never  Vaping Use   Vaping status: Never Used  Substance and Sexual Activity   Alcohol use: Yes    Comment: occasionally   Drug use: Never   Social Determinants of Health   Financial Resource Strain: Low Risk  (  03/11/2022)   Received from [facility],    Overall Financial Resource Strain (CARDIA)    Difficulty of Paying Living Expenses: Not hard at all    FAMILY HISTORY:  Family History  Problem Relation Name Age of Onset   Diabetes type II Mother     High blood pressure (Hypertension) Mother     Diabetes type II Father     Diabetes type II Brother       GENERAL REVIEW OF SYSTEMS:   General ROS: negative for - chills, fatigue, fever, weight gain or weight loss Allergy and Immunology ROS: negative for - hives  Hematological and Lymphatic ROS: negative for - bleeding problems or bruising, negative for palpable nodes Endocrine ROS: negative for - heat or cold intolerance, hair changes Respiratory ROS: negative for - cough, shortness of breath or  wheezing Cardiovascular ROS: no chest pain or palpitations GI ROS: negative for nausea, vomiting, abdominal pain, diarrhea, constipation Musculoskeletal ROS: negative for - joint swelling or muscle pain Neurological ROS: negative for - confusion, syncope Dermatological ROS: negative for pruritus and rash  PHYSICAL EXAM:  Vitals:   04/24/23 0909  BP: (!) 163/105  Pulse: 96  .  Ht:162.6 cm (5\' 4" ) Wt:61.7 kg (136 lb) ZHY:QMVH surface area is 1.67 meters squared. Body mass index is 23.34 kg/m.Marland Kitchen   GENERAL: Alert, active, oriented x3  HEENT: Pupils equal reactive to light. Extraocular movements are intact. Sclera clear. Palpebral conjunctiva normal red color.Pharynx clear.  NECK: Supple with no palpable mass and no adenopathy.  LUNGS: Sound clear with no rales rhonchi or wheezes.  HEART: Regular rhythm S1 and S2 without murmur.  BREAST: Right breast upper inner quadrant lump.  Bedside shows fluid collection.  No axillary adenopathy.  No skin changes.  No other palpable masses.  EXTREMITIES: Well-developed well-nourished symmetrical with no dependent edema.  NEUROLOGICAL: Awake alert oriented, facial expression symmetrical, moving all extremities.      IMPRESSION:     Hematoma of right breast [N64.89] -Patient with history of partial mastectomy in 2022 -Now with not resolving fluid collection of the right breast -Last mammogram on April negative for any concerning malignancy -Will proceed with drainage of deep fluid collection possible biopsy if needed. -Patient oriented about the risk including bleeding, infection, pain, scar tissue, among others.  The patient reports she understood and agreed to proceed with plan.          PLAN:  1.  Drainage of deep hematoma of the right breast  (19020) 2. Avoid taking aspirin of blood thinner 5 days before procedure 3. Contact us if you have any concern.   Patient verbalized understanding, all questions were answered, and were agreeable  with the plan outlined above.   Carolan Shiver, MD  Electronically signed by Carolan Shiver, MD

## 2023-04-24 NOTE — H&P (Signed)
HISTORY OF PRESENT ILLNESS:    Ebony Steele is a 47 y.o.female patient who comes for follow up right breast hematoma.  Patient with history of partial mastectomy on 01/09/2021.  She presented a month ago with a lump of the right breast on the incision area.  Patient had a history of right breast cancer and diagnostic mammogram was done on December 15, 2022.  This shows expected postoperative changes without any significant fluid collection.  Patient denies any trauma to the right breast.  I tried to aspirate the fluid last month but it was too thick consistent with chronic hematoma.  At this point have low suspicion of recurrent malignancy but she endorsed that the hematoma is not resolving by itself.  There is localized pain to the right breast.  No pain radiation.  Pain aggravated by applying pressure.  No aggravating factors.  Denies any fever.      PAST MEDICAL HISTORY:  Past Medical History:  Diagnosis Date   Anxiety    Constipation    Depression    Ductal carcinoma in situ (DCIS) of right breast    HTN (hypertension)    Hypothyroidism         PAST SURGICAL HISTORY:   Past Surgical History:  Procedure Laterality Date   MASTECTOMY PARTIAL Right 01/09/2021   Dr Arrie Senate -- w/ RF         MEDICATIONS:  Outpatient Encounter Medications as of 04/24/2023  Medication Sig Dispense Refill   albuterol 90 mcg/actuation inhaler Inhale 2 inhalations into the lungs every 4 (four) hours as needed for Wheezing. 1 Inhaler 0   ARIPiprazole (ABILIFY) 5 MG tablet Take 1 tablet by mouth once daily     buPROPion (WELLBUTRIN XL) 150 MG XL tablet Take 150 mg by mouth     busPIRone (BUSPAR) 5 MG tablet Take 1 tablet by mouth 3 (three) times daily as needed     levothyroxine (SYNTHROID) 88 MCG tablet Take 88 mcg by mouth once daily Take on an empty stomach with a glass of water at least 30-60 minutes before breakfast.     omeprazole (PRILOSEC) 40 MG DR capsule Take by mouth as needed     ondansetron  (ZOFRAN-ODT) 4 MG disintegrating tablet Take 1 tablet (4 mg total) by mouth every 8 (eight) hours as needed for Nausea. 20 tablet 0   traZODone (DESYREL) 50 MG tablet Take by mouth     benzonatate (TESSALON) 100 MG capsule Take by mouth (Patient not taking: Reported on 12/24/2020)     buPROPion (WELLBUTRIN XL) 300 MG XL tablet Take 1 tablet by mouth once daily (Patient not taking: Reported on 12/25/2022)     fluticasone (FLONASE) 50 mcg/actuation nasal spray Place 1 spray into both nostrils 2 (two) times daily. (Patient not taking: Reported on 12/09/2018) 16 g 0   No facility-administered encounter medications on file as of 04/24/2023.     ALLERGIES:   Tylenol [acetaminophen]   SOCIAL HISTORY:  Social History   Socioeconomic History   Marital status: Married  Tobacco Use   Smoking status: Former    Current packs/day: 0.00    Types: Cigarettes    Quit date: 12/20/2020    Years since quitting: 2.3   Smokeless tobacco: Never  Vaping Use   Vaping status: Never Used  Substance and Sexual Activity   Alcohol use: Yes    Comment: occasionally   Drug use: Never   Social Determinants of Health   Financial Resource Strain: Low Risk  (  03/11/2022)   Received from [facility],    Overall Financial Resource Strain (CARDIA)    Difficulty of Paying Living Expenses: Not hard at all    FAMILY HISTORY:  Family History  Problem Relation Name Age of Onset   Diabetes type II Mother     High blood pressure (Hypertension) Mother     Diabetes type II Father     Diabetes type II Brother       GENERAL REVIEW OF SYSTEMS:   General ROS: negative for - chills, fatigue, fever, weight gain or weight loss Allergy and Immunology ROS: negative for - hives  Hematological and Lymphatic ROS: negative for - bleeding problems or bruising, negative for palpable nodes Endocrine ROS: negative for - heat or cold intolerance, hair changes Respiratory ROS: negative for - cough, shortness of breath or  wheezing Cardiovascular ROS: no chest pain or palpitations GI ROS: negative for nausea, vomiting, abdominal pain, diarrhea, constipation Musculoskeletal ROS: negative for - joint swelling or muscle pain Neurological ROS: negative for - confusion, syncope Dermatological ROS: negative for pruritus and rash  PHYSICAL EXAM:  Vitals:   04/24/23 0909  BP: (!) 163/105  Pulse: 96  .  Ht:162.6 cm (5\' 4" ) Wt:61.7 kg (136 lb) ZHY:QMVH surface area is 1.67 meters squared. Body mass index is 23.34 kg/m.Marland Kitchen   GENERAL: Alert, active, oriented x3  HEENT: Pupils equal reactive to light. Extraocular movements are intact. Sclera clear. Palpebral conjunctiva normal red color.Pharynx clear.  NECK: Supple with no palpable mass and no adenopathy.  LUNGS: Sound clear with no rales rhonchi or wheezes.  HEART: Regular rhythm S1 and S2 without murmur.  BREAST: Right breast upper inner quadrant lump.  Bedside shows fluid collection.  No axillary adenopathy.  No skin changes.  No other palpable masses.  EXTREMITIES: Well-developed well-nourished symmetrical with no dependent edema.  NEUROLOGICAL: Awake alert oriented, facial expression symmetrical, moving all extremities.      IMPRESSION:     Hematoma of right breast [N64.89] -Patient with history of partial mastectomy in 2022 -Now with not resolving fluid collection of the right breast -Last mammogram on April negative for any concerning malignancy -Will proceed with drainage of deep fluid collection possible biopsy if needed. -Patient oriented about the risk including bleeding, infection, pain, scar tissue, among others.  The patient reports she understood and agreed to proceed with plan.          PLAN:  1.  Drainage of deep hematoma of the right breast  (19020) 2. Avoid taking aspirin of blood thinner 5 days before procedure 3. Contact us if you have any concern.   Patient verbalized understanding, all questions were answered, and were agreeable  with the plan outlined above.   Carolan Shiver, MD  Electronically signed by Carolan Shiver, MD

## 2023-04-27 ENCOUNTER — Other Ambulatory Visit: Payer: Self-pay

## 2023-04-27 ENCOUNTER — Encounter
Admission: RE | Admit: 2023-04-27 | Discharge: 2023-04-27 | Disposition: A | Discharge status: Home / Self Care | Payer: BC Managed Care – PPO | Source: Ambulatory Visit | Attending: General Surgery | Admitting: General Surgery

## 2023-04-27 VITALS — Ht 64.0 in | Wt 143.0 lb

## 2023-04-27 DIAGNOSIS — Z01818 Encounter for other preprocedural examination: Secondary | ICD-10-CM

## 2023-04-27 HISTORY — DX: Hypothyroidism, unspecified: E03.9

## 2023-04-27 HISTORY — DX: Insomnia, unspecified: G47.00

## 2023-04-27 HISTORY — DX: Depression, unspecified: F32.A

## 2023-04-27 HISTORY — DX: Anxiety disorder, unspecified: F41.9

## 2023-04-27 NOTE — Patient Instructions (Signed)
Your procedure is scheduled on: Friday 05/01/23 To find out your arrival time, please call [phone] between 1PM - 3PM on:   Thursday 04/30/23 Report to the Registration Desk on the 1st floor of the [facility]. FREE Valet parking is available.  If your arrival time is 6:00 am, do not arrive before that time as the [facility] entrance doors do not open until 6:00 am.  REMEMBER: Instructions that are not followed completely may result in serious medical risk, up to and including death; or upon the discretion of your surgeon and anesthesiologist your surgery may need to be rescheduled.  Do not eat food or drink any liquids after midnight the night before surgery.  No gum chewing or hard candies.  One week prior to surgery: Stop Anti-inflammatories (NSAIDS) such as Advil, Aleve, Ibuprofen, Motrin, Naproxen, Naprosyn and Aspirin based products such as Excedrin, Goody's Powder, BC Powder. Contact Dr Maia PlanTrisha Mangle regarding stopping your ibuprofen as discussed..  Stop ANY OVER THE COUNTER supplements until after surgery.  Continue taking all prescribed medications.   TAKE ONLY THESE MEDICATIONS THE MORNING OF SURGERY WITH A SIP OF WATER:  ARIPiprazole (ABILIFY) 5 MG tablet  levothyroxine (SYNTHROID) 88 MCG tablet  omeprazole (PRILOSEC) 40 MG capsule Antacid (take one the night before and one on the morning of surgery - helps to prevent nausea after surgery.) buPROPion (WELLBUTRIN XL) 150 MG 24 hr tablet if scheduled for dose busPIRone (BUSPAR) 5 MG tablet if needed  Use inhalers on the day of surgery and bring to the hospital.  No Alcohol for 24 hours before or after surgery.  No Smoking including e-cigarettes for 24 hours before surgery.  No chewable tobacco products for at least 6 hours before surgery.  No nicotine patches on the day of surgery.  Do not use any "recreational" drugs for at least a week (preferably 2 weeks) before your surgery.  Please be advised that the  combination of cocaine and anesthesia may have negative outcomes, up to and including death. If you test positive for cocaine, your surgery will be cancelled.  On the morning of surgery brush your teeth with toothpaste and water, you may rinse your mouth with mouthwash if you wish. Do not swallow any toothpaste or mouthwash.  Use CHG Soap or wipes as directed on instruction sheet. Pick up at office [facility] [address].  Do not wear lotions, powders, or perfumes. No deodorant  Do not shave body hair from the neck down 48 hours before surgery.  Wear comfortable clothing (specific to your surgery type) to the hospital.  Do not wear jewelry, make-up, hairpins, clips or nail polish.  Contact lenses, hearing aids and dentures may not be worn into surgery.  Bring your C-PAP to the hospital in case you may have to spend the night.   Do not bring valuables to the hospital. [facility] is not responsible for any missing/lost belongings or valuables.   Notify your doctor if there is any change in your medical condition (cold, fever, infection).  If you are being discharged the day of surgery, you will not be allowed to drive home. You will need a responsible individual to drive you home and stay with you for 24 hours after surgery.   If you are taking public transportation, you will need to have a responsible individual with you.  If you are being admitted to the hospital overnight, leave your suitcase in the car. After surgery it  may be brought to your room.  In case of increased patient census, it may be necessary for you, the patient, to continue your postoperative care in the Same Day Surgery department.  After surgery, you can help prevent lung complications by doing breathing exercises.  Take deep breaths and cough every 1-2 hours. Your doctor may order a device called an Incentive Spirometer to help you take deep breaths. When coughing or  sneezing, hold a pillow firmly against your incision with both hands. This is called "splinting." Doing this helps protect your incision. It also decreases belly discomfort.  Surgery Visitation Policy:  Patients undergoing a surgery or procedure may have two family members or support persons with them as long as the person is not COVID-19 positive or experiencing its symptoms.   Inpatient Visitation:    Visiting hours are 7 a.m. to 8 p.m. Up to four visitors are allowed at one time in a patient room. The visitors may rotate out with other people during the day. One designated support person (adult) may remain overnight.  Please call the Pre-admissions Testing Dept. at [phone] if you have any questions about these instructions.     Preparing for Surgery with CHLORHEXIDINE GLUCONATE (CHG) Soap  Chlorhexidine Gluconate (CHG) Soap  o An antiseptic cleaner that kills germs and bonds with the skin to continue killing germs even after washing  o Used for showering the night before surgery and morning of surgery  Before surgery, you can play an important role by reducing the number of germs on your skin.  CHG (Chlorhexidine gluconate) soap is an antiseptic cleanser which kills germs and bonds with the skin to continue killing germs even after washing.  Please do not use if you have an allergy to CHG or antibacterial soaps. If your skin becomes reddened/irritated stop using the CHG.  1. Shower the NIGHT BEFORE SURGERY and the MORNING OF SURGERY with CHG soap.  2. If you choose to wash your hair, wash your hair first as usual with your normal shampoo.  3. After shampooing, rinse your hair and body thoroughly to remove the shampoo.  4. Use CHG as you would any other liquid soap. You can apply CHG directly to the skin and wash gently with a scrungie or a clean washcloth.  5. Apply the CHG soap to your body only from the neck down. Do not use on open wounds or open sores. Avoid contact  with your eyes, ears, mouth, and genitals (private parts). Wash face and genitals (private parts) with your normal soap.  6. Wash thoroughly, paying special attention to the area where your surgery will be performed.  7. Thoroughly rinse your body with warm water.  8. Do not shower/wash with your normal soap after using and rinsing off the CHG soap.  9. Pat yourself dry with a clean towel.  10. Wear clean pajamas to bed the night before surgery.  12. Place clean sheets on your bed the night of your first shower and do not sleep with pets.  13. Shower again with the CHG soap on the day of surgery prior to arriving at the hospital.  14. Do not apply any deodorants/lotions/powders.  15. Please wear clean clothes to the hospital.

## 2023-04-30 ENCOUNTER — Ambulatory Visit: Payer: BC Managed Care – PPO | Attending: Radiation Oncology | Admitting: Radiation Oncology

## 2023-04-30 MED ORDER — LACTATED RINGERS IV SOLN: INTRAVENOUS | Status: DC

## 2023-04-30 MED ORDER — CHLORHEXIDINE GLUCONATE 0.12 % MT SOLN
15.0000 mL | Freq: Once | OROMUCOSAL | Status: AC
  Administered 2023-05-01: 15 mL via OROMUCOSAL

## 2023-04-30 MED ORDER — CEFAZOLIN SODIUM-DEXTROSE 2-4 GM/100ML-% IV SOLN
2.0000 g | INTRAVENOUS | Status: AC
  Administered 2023-05-01: 2 g via INTRAVENOUS

## 2023-04-30 MED ORDER — ORAL CARE MOUTH RINSE: 15.0000 mL | Freq: Once | OROMUCOSAL | Status: AC

## 2023-05-01 ENCOUNTER — Ambulatory Visit: Payer: BC Managed Care – PPO | Admitting: Urgent Care

## 2023-05-01 ENCOUNTER — Ambulatory Visit: Payer: BC Managed Care – PPO | Admitting: Certified Registered"

## 2023-05-01 ENCOUNTER — Ambulatory Visit
Admission: RE | Admit: 2023-05-01 | Discharge: 2023-05-01 | Disposition: A | Discharge status: Home / Self Care | Payer: BC Managed Care – PPO | Attending: General Surgery | Admitting: General Surgery

## 2023-05-01 ENCOUNTER — Other Ambulatory Visit: Payer: Self-pay

## 2023-05-01 ENCOUNTER — Encounter
Admission: RE | Disposition: A | Discharge status: Home / Self Care | Payer: Self-pay | Source: Home / Self Care | Attending: General Surgery

## 2023-05-01 ENCOUNTER — Encounter: Payer: Self-pay | Admitting: General Surgery

## 2023-05-01 DIAGNOSIS — Z87891 Personal history of nicotine dependence: Secondary | ICD-10-CM | POA: Insufficient documentation

## 2023-05-01 DIAGNOSIS — K219 Gastro-esophageal reflux disease without esophagitis: Secondary | ICD-10-CM | POA: Diagnosis not present

## 2023-05-01 DIAGNOSIS — N6489 Other specified disorders of breast: Secondary | ICD-10-CM | POA: Insufficient documentation

## 2023-05-01 DIAGNOSIS — N6031 Fibrosclerosis of right breast: Secondary | ICD-10-CM | POA: Insufficient documentation

## 2023-05-01 DIAGNOSIS — Z923 Personal history of irradiation: Secondary | ICD-10-CM | POA: Insufficient documentation

## 2023-05-01 DIAGNOSIS — E039 Hypothyroidism, unspecified: Secondary | ICD-10-CM | POA: Diagnosis not present

## 2023-05-01 DIAGNOSIS — J45909 Unspecified asthma, uncomplicated: Secondary | ICD-10-CM | POA: Insufficient documentation

## 2023-05-01 DIAGNOSIS — Z853 Personal history of malignant neoplasm of breast: Secondary | ICD-10-CM | POA: Diagnosis not present

## 2023-05-01 DIAGNOSIS — Z01818 Encounter for other preprocedural examination: Secondary | ICD-10-CM

## 2023-05-01 HISTORY — PX: INCISION AND DRAINAGE ABSCESS: SHX5864

## 2023-05-01 LAB — POCT PREGNANCY, URINE: Preg Test, Ur: NEGATIVE

## 2023-05-01 SURGERY — INCISION AND DRAINAGE, ABSCESS
Anesthesia: General | Site: Breast | Laterality: Right

## 2023-05-01 MED ORDER — PROPOFOL 10 MG/ML IV BOLUS
INTRAVENOUS | Status: AC
  Filled 2023-05-01: qty 20

## 2023-05-01 MED ORDER — MIDAZOLAM HCL 2 MG/2ML IJ SOLN
INTRAMUSCULAR | Status: DC | PRN
  Administered 2023-05-01: 2 mg via INTRAVENOUS

## 2023-05-01 MED ORDER — PROPOFOL 10 MG/ML IV BOLUS
INTRAVENOUS | Status: DC | PRN
  Administered 2023-05-01: 150 mg via INTRAVENOUS
  Administered 2023-05-01: 200 mg via INTRAVENOUS

## 2023-05-01 MED ORDER — FENTANYL CITRATE (PF) 100 MCG/2ML IJ SOLN: 25.0000 ug | INTRAMUSCULAR | Status: DC | PRN

## 2023-05-01 MED ORDER — TRAMADOL HCL 50 MG PO TABS
50.0000 mg | ORAL_TABLET | Freq: Four times a day (QID) | ORAL | 0 refills | Status: AC | PRN
Start: 2023-05-01 — End: 2024-04-30

## 2023-05-01 MED ORDER — BUPIVACAINE-EPINEPHRINE (PF) 0.25% -1:200000 IJ SOLN
INTRAMUSCULAR | Status: AC
  Filled 2023-05-01: qty 30

## 2023-05-01 MED ORDER — CHLORHEXIDINE GLUCONATE 0.12 % MT SOLN
OROMUCOSAL | Status: AC
  Filled 2023-05-01: qty 15

## 2023-05-01 MED ORDER — FENTANYL CITRATE (PF) 100 MCG/2ML IJ SOLN
INTRAMUSCULAR | Status: DC | PRN
  Administered 2023-05-01 (×2): 50 ug via INTRAVENOUS

## 2023-05-01 MED ORDER — FENTANYL CITRATE (PF) 100 MCG/2ML IJ SOLN
INTRAMUSCULAR | Status: AC
  Filled 2023-05-01: qty 2

## 2023-05-01 MED ORDER — DEXMEDETOMIDINE HCL IN NACL 200 MCG/50ML IV SOLN
INTRAVENOUS | Status: DC | PRN
Start: 2023-05-01 — End: 2023-05-01
  Administered 2023-05-01: 8 ug via INTRAVENOUS

## 2023-05-01 MED ORDER — ACETAMINOPHEN 10 MG/ML IV SOLN
INTRAVENOUS | Status: AC
  Filled 2023-05-01: qty 100

## 2023-05-01 MED ORDER — DEXAMETHASONE SODIUM PHOSPHATE 10 MG/ML IJ SOLN
INTRAMUSCULAR | Status: DC | PRN
  Administered 2023-05-01: 10 mg via INTRAVENOUS

## 2023-05-01 MED ORDER — DROPERIDOL 2.5 MG/ML IJ SOLN: 0.6250 mg | Freq: Once | INTRAMUSCULAR | Status: DC | PRN

## 2023-05-01 MED ORDER — LIDOCAINE HCL (CARDIAC) PF 100 MG/5ML IV SOSY
PREFILLED_SYRINGE | INTRAVENOUS | Status: DC | PRN
  Administered 2023-05-01: 100 mg via INTRAVENOUS

## 2023-05-01 MED ORDER — OXYCODONE HCL 5 MG PO TABS: 5.0000 mg | ORAL_TABLET | Freq: Once | ORAL | Status: DC | PRN

## 2023-05-01 MED ORDER — MIDAZOLAM HCL 2 MG/2ML IJ SOLN
INTRAMUSCULAR | Status: AC
  Filled 2023-05-01: qty 2

## 2023-05-01 MED ORDER — ACETAMINOPHEN 10 MG/ML IV SOLN: 1000.0000 mg | Freq: Once | INTRAVENOUS | Status: DC | PRN

## 2023-05-01 MED ORDER — CEFAZOLIN SODIUM-DEXTROSE 2-4 GM/100ML-% IV SOLN
INTRAVENOUS | Status: AC
  Filled 2023-05-01: qty 100

## 2023-05-01 MED ORDER — BUPIVACAINE-EPINEPHRINE (PF) 0.25% -1:200000 IJ SOLN
INTRAMUSCULAR | Status: DC | PRN
Start: 2023-05-01 — End: 2023-05-01
  Administered 2023-05-01: 30 mL

## 2023-05-01 MED ORDER — PROMETHAZINE HCL 25 MG/ML IJ SOLN: 6.2500 mg | INTRAMUSCULAR | Status: DC | PRN

## 2023-05-01 MED ORDER — OXYCODONE HCL 5 MG/5ML PO SOLN: 5.0000 mg | Freq: Once | ORAL | Status: DC | PRN

## 2023-05-01 MED ORDER — ONDANSETRON HCL 4 MG/2ML IJ SOLN
INTRAMUSCULAR | Status: DC | PRN
  Administered 2023-05-01: 4 mg via INTRAVENOUS

## 2023-05-01 SURGICAL SUPPLY — 26 items
APL PRP STRL LF DISP 70% ISPRP (MISCELLANEOUS) ×1
BLADE CLIPPER SURG (BLADE) ×1 IMPLANT
BLADE SURG SZ11 CARB STEEL (BLADE) ×1 IMPLANT
CHLORAPREP W/TINT 26 (MISCELLANEOUS) ×1 IMPLANT
DRAPE LAPAROTOMY 77X122 PED (DRAPES) ×1 IMPLANT
ELECT REM PT RETURN 9FT ADLT (ELECTROSURGICAL) ×1
ELECTRODE REM PT RTRN 9FT ADLT (ELECTROSURGICAL) ×1 IMPLANT
GAUZE PACKING IODOFORM 1/2INX (GAUZE/BANDAGES/DRESSINGS) IMPLANT
GAUZE SPONGE 4X4 12PLY STRL (GAUZE/BANDAGES/DRESSINGS) ×1 IMPLANT
GLOVE BIO SURGEON STRL SZ 6.5 (GLOVE) ×1 IMPLANT
GLOVE BIOGEL PI IND STRL 6.5 (GLOVE) ×1 IMPLANT
GOWN STRL REUS W/ TWL LRG LVL3 (GOWN DISPOSABLE) ×2 IMPLANT
GOWN STRL REUS W/TWL LRG LVL3 (GOWN DISPOSABLE) ×2
HEMOSTAT ARISTA ABSORB 3G PWDR (HEMOSTASIS) IMPLANT
MANIFOLD NEPTUNE II (INSTRUMENTS) ×1 IMPLANT
NDL HYPO 22X1.5 SAFETY MO (MISCELLANEOUS) ×1 IMPLANT
NEEDLE HYPO 22X1.5 SAFETY MO (MISCELLANEOUS) ×1 IMPLANT
NS IRRIG 1000ML POUR BTL (IV SOLUTION) ×1 IMPLANT
PACK BASIN MINOR ARMC (MISCELLANEOUS) ×1 IMPLANT
SOL PREP PVP 2OZ (MISCELLANEOUS) ×2
SOLUTION PREP PVP 2OZ (MISCELLANEOUS) ×2 IMPLANT
SPONGE T-LAP 18X18 ~~LOC~~+RFID (SPONGE) ×1 IMPLANT
SUT ETHILON 3-0 FS-10 30 BLK (SUTURE) ×1
SUTURE EHLN 3-0 FS-10 30 BLK (SUTURE) IMPLANT
TRAP FLUID SMOKE EVACUATOR (MISCELLANEOUS) ×1 IMPLANT
WATER STERILE IRR 500ML POUR (IV SOLUTION) ×1 IMPLANT

## 2023-05-01 NOTE — Interval H&P Note (Signed)
History and Physical Interval Note:  05/01/2023 2:55 PM  Ebony Steele  has presented today for surgery, with the diagnosis of N64.89 Hematoma of rt breast.  The various methods of treatment have been discussed with the patient and family. After consideration of risks, benefits and other options for treatment, the patient has consented to  Procedure(s): INCISION AND DRAINAGE Hematoma (Right) as a surgical intervention.  The patient's history has been reviewed, patient examined, no change in status, stable for surgery.  I have reviewed the patient's chart and labs.  Questions were answered to the patient's satisfaction.     Carolan Shiver

## 2023-05-01 NOTE — Discharge Instructions (Addendum)
  Diet: Resume home heart healthy regular diet.   Activity: No heavy lifting >20 pounds (children, pets, laundry, garbage) or strenuous activity until follow-up, but light activity and walking are encouraged. Do not drive or drink alcohol if taking narcotic pain medications.  Wound care: Remove dressing tomorrow. Once dressing removed, may shower with soapy water and pat dry (do not rub incisions), but no baths or submerging incision underwater until follow-up. (no swimming)   Medications: Resume all home medications. For mild to moderate pain: acetaminophen (Tylenol) or ibuprofen (if no kidney disease). Combining Tylenol with alcohol can substantially increase your risk of causing liver disease. Narcotic pain medications, if prescribed, can be used for severe pain, though may cause nausea, constipation, and drowsiness.  If you do not need the narcotic pain medication, you do not need to fill the prescription.  Call office [phone]) at any time if any questions, worsening pain, fevers/chills, bleeding, drainage from incision site, or other concerns. AMBULATORY SURGERY  DISCHARGE INSTRUCTIONS   The drugs that you were given will stay in your system until tomorrow so for the next 24 hours you should not:  Drive an automobile Make any legal decisions Drink any alcoholic beverage   You may resume regular meals tomorrow.  Today it is better to start with liquids and gradually work up to solid foods.  You may eat anything you prefer, but it is better to start with liquids, then soup and crackers, and gradually work up to solid foods.   Please notify your doctor immediately if you have any unusual bleeding, trouble breathing, redness and pain at the surgery site, drainage, fever, or pain not relieved by medication.    Additional Instructions:        Please contact your physician with any problems or Same Day Surgery at [phone], Monday through Friday 6 am to 4 pm, or [facility] number at [phone].

## 2023-05-01 NOTE — Transfer of Care (Signed)
Immediate Anesthesia Transfer of Care Note  Patient: Ebony Steele  Procedure(s) Performed: INCISION AND DRAINAGE Hematoma (Right: Breast)  Patient Location: PACU  Anesthesia Type:General  Level of Consciousness: drowsy  Airway & Oxygen Therapy: Patient Spontanous Breathing and Patient connected to face mask oxygen  Post-op Assessment: Report given to RN and Post -op Vital signs reviewed and stable  Post vital signs: stable  Last Vitals:  Vitals Value Taken Time  BP 101/61 05/01/23 1602  Temp    Pulse 69 05/01/23 1606  Resp 12 05/01/23 1606  SpO2 100 % 05/01/23 1606  Vitals shown include unfiled device data.  Last Pain:  Vitals:   05/01/23 1323  PainSc: 2          Complications: No notable events documented.

## 2023-05-01 NOTE — Anesthesia Procedure Notes (Signed)
Procedure Name: LMA Insertion Date/Time: 05/01/2023 3:27 PM  Performed by: Maryla Morrow., CRNAPre-anesthesia Checklist: Patient identified, Patient being monitored, Timeout performed, Emergency Drugs available and Suction available Patient Re-evaluated:Patient Re-evaluated prior to induction Oxygen Delivery Method: Circle system utilized Preoxygenation: Pre-oxygenation with 100% oxygen Induction Type: IV induction Ventilation: Mask ventilation without difficulty LMA: LMA inserted Laryngoscope Size: 4 Tube type: Oral Number of attempts: 1 Placement Confirmation: positive ETCO2 and breath sounds checked- equal and bilateral Tube secured with: Tape Dental Injury: Teeth and Oropharynx as per pre-operative assessment

## 2023-05-01 NOTE — Anesthesia Preprocedure Evaluation (Signed)
Anesthesia Evaluation  Patient identified by MRN, date of birth, ID band Patient awake    Reviewed: Allergy & Precautions, H&P , NPO status , Patient's Chart, lab work & pertinent test results, reviewed documented beta blocker date and time   Airway Mallampati: II  TM Distance: >3 FB Neck ROM: full    Dental  (+) Teeth Intact   Pulmonary neg shortness of breath, asthma , neg recent URI, former smoker   Pulmonary exam normal        Cardiovascular Exercise Tolerance: Good negative cardio ROS Normal cardiovascular exam Rate:Normal     Neuro/Psych  Headaches PSYCHIATRIC DISORDERS Anxiety Depression       GI/Hepatic Neg liver ROS,GERD  Medicated,,  Endo/Other  Hypothyroidism    Renal/GU negative Renal ROS  negative genitourinary   Musculoskeletal   Abdominal   Peds  Hematology  (+) Blood dyscrasia, anemia   Anesthesia Other Findings   Reproductive/Obstetrics negative OB ROS                             Anesthesia Physical Anesthesia Plan  ASA: 2  Anesthesia Plan: General LMA   Post-op Pain Management:    Induction:   PONV Risk Score and Plan: 4 or greater  Airway Management Planned:   Additional Equipment:   Intra-op Plan:   Post-operative Plan:   Informed Consent: I have reviewed the patients History and Physical, chart, labs and discussed the procedure including the risks, benefits and alternatives for the proposed anesthesia with the patient or authorized representative who has indicated his/her understanding and acceptance.       Plan Discussed with: CRNA  Anesthesia Plan Comments:        Anesthesia Quick Evaluation

## 2023-05-01 NOTE — Op Note (Signed)
Preoperative diagnosis: Right breast hematoma.  Postoperative diagnosis: Same.   Procedure: Right breast mastotomy with drainage of hematoma and incisional biopsy                      Anesthesia: GETA  Surgeon: Dr. Hazle Quant  Wound Classification: Clean  Indications: Patient is a 47 y.o. female with history of lumpectomy that developed a hematoma unable to be aspirated. Biopsy indicated to rule out malignancy   Findings: Dark old blood drained. No palpable masses  Description of procedure: The patient was taken to the operating room and placed supine on the operating table, and after general anesthesia the right chest was prepped and draped in the usual sterile fashion. A time-out was completed verifying correct patient, procedure, site, positioning, and implant(s) and/or special equipment prior to beginning this procedure.  The skin incision was made and dissection taken down deep in the breast tissue to the old partial mastectomy cavity. Hematoma drained. Cavity tissue taken for biopsy. Cavity irrigated with water. Hemostasis achieved. Skin closed with two interrupted sutures.   Specimen: Right breast cavity biopsy                      Complications: None  Estimated Blood Loss: 5 mL

## 2023-05-01 NOTE — Anesthesia Postprocedure Evaluation (Signed)
Anesthesia Post Note  Patient: Ebony Steele  Procedure(s) Performed: INCISION AND DRAINAGE Hematoma (Right: Breast)  Patient location during evaluation: PACU Anesthesia Type: General Level of consciousness: awake and alert Pain management: pain level controlled Vital Signs Assessment: post-procedure vital signs reviewed and stable Respiratory status: spontaneous breathing, nonlabored ventilation, respiratory function stable and patient connected to nasal cannula oxygen Cardiovascular status: blood pressure returned to baseline and stable Postop Assessment: no apparent nausea or vomiting Anesthetic complications: no   There were no known notable events for this encounter.   Last Vitals:  Vitals:   05/01/23 1658 05/01/23 1701  BP: (!) 157/84 (!) 150/82  Pulse: 64   Resp: 17   Temp: (!) 36.2 C   SpO2: 100%     Last Pain:  Vitals:   05/01/23 1658  TempSrc: Temporal  PainSc: 0-No pain                 Corinda Gubler

## 2023-05-02 ENCOUNTER — Encounter: Payer: Self-pay | Admitting: General Surgery

## 2023-05-05 ENCOUNTER — Inpatient Hospital Stay: Payer: BC Managed Care – PPO | Attending: Oncology | Admitting: Oncology

## 2023-11-23 IMAGING — MG DIGITAL DIAGNOSTIC BILAT W/ TOMO W/ CAD
8 of 13 series · 9 of 37 positions shown · non-contrast
Comparison: Previous exam(s).

CLINICAL DATA: 45-year-old female presenting for annual exam,
history of right breast cancer in 2922 status post lumpectomy. No
new problems.

EXAM:
DIGITAL DIAGNOSTIC BILATERAL MAMMOGRAM WITH TOMOSYNTHESIS AND CAD
TECHNIQUE: Bilateral digital diagnostic mammography and breast tomosynthesis
was performed. The images were evaluated with computer-aided
detection.

[R MLO]
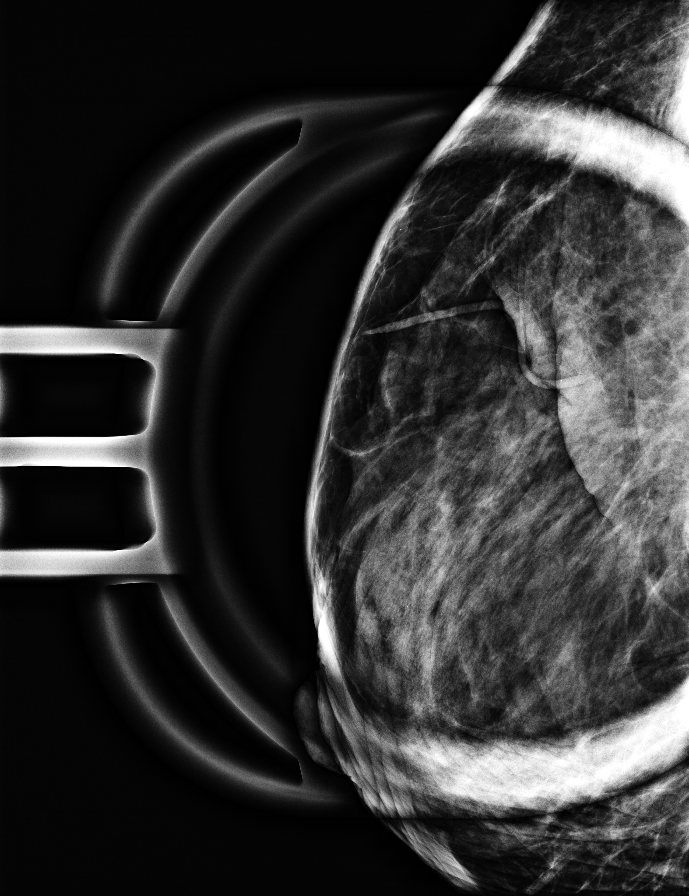

[L MLO synth-2D]
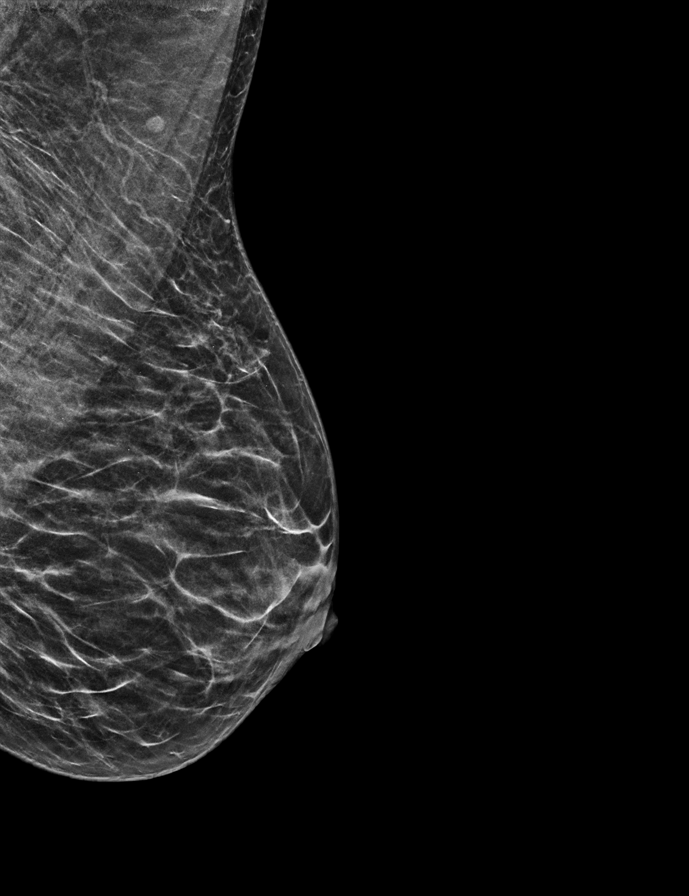

[R CC synth-2D (1 of 2)]
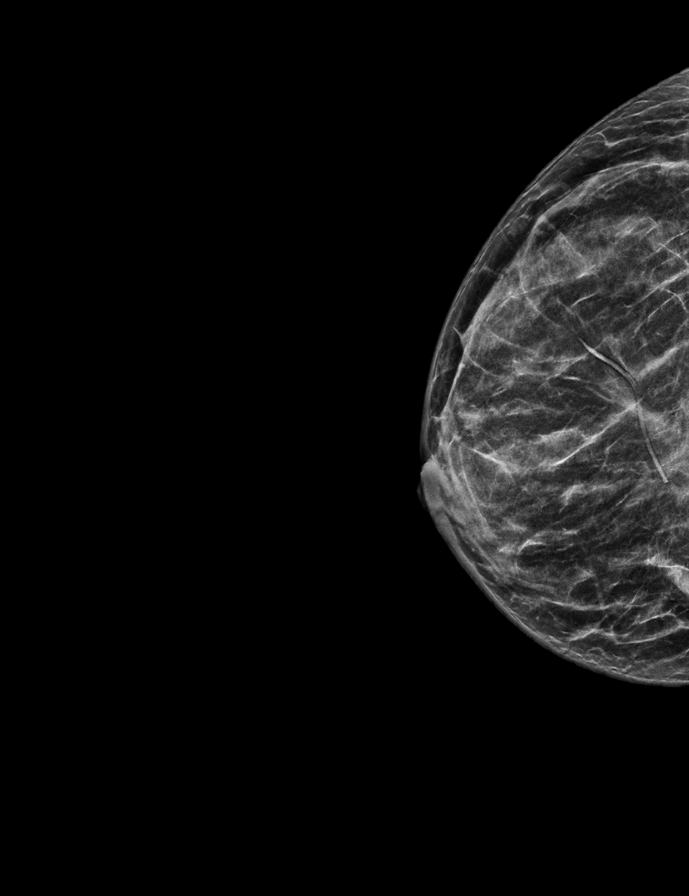

[R MLO synth-2D (1 of 2)]
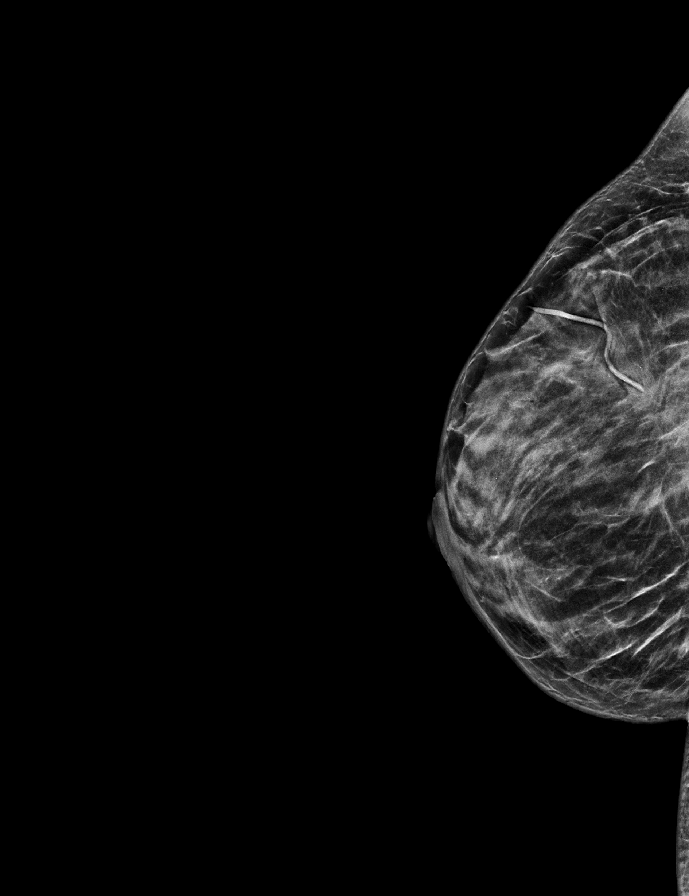

[R CC synth-2D (2 of 2)]
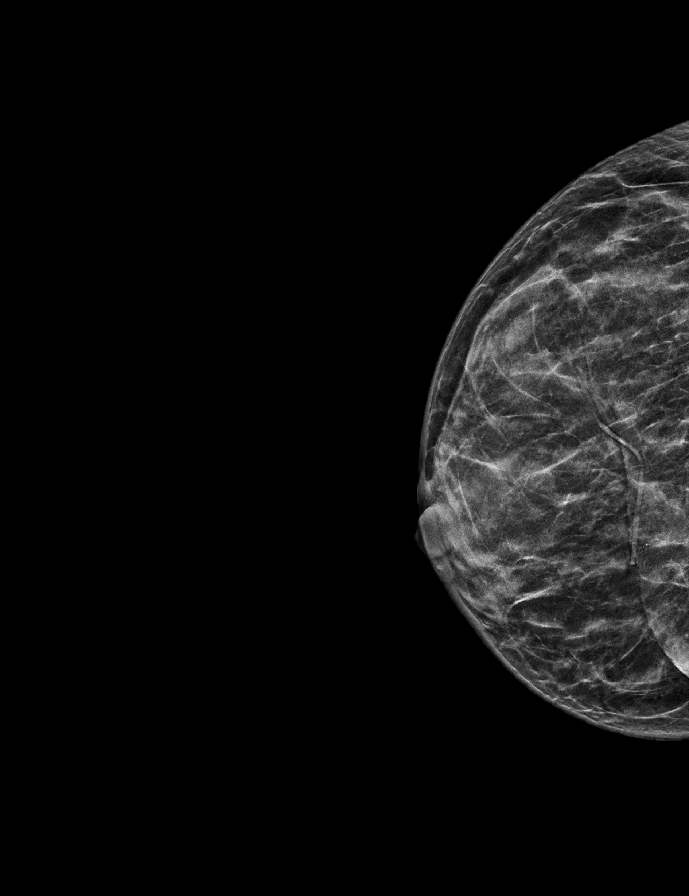

[R MLO synth-2D (2 of 2)]
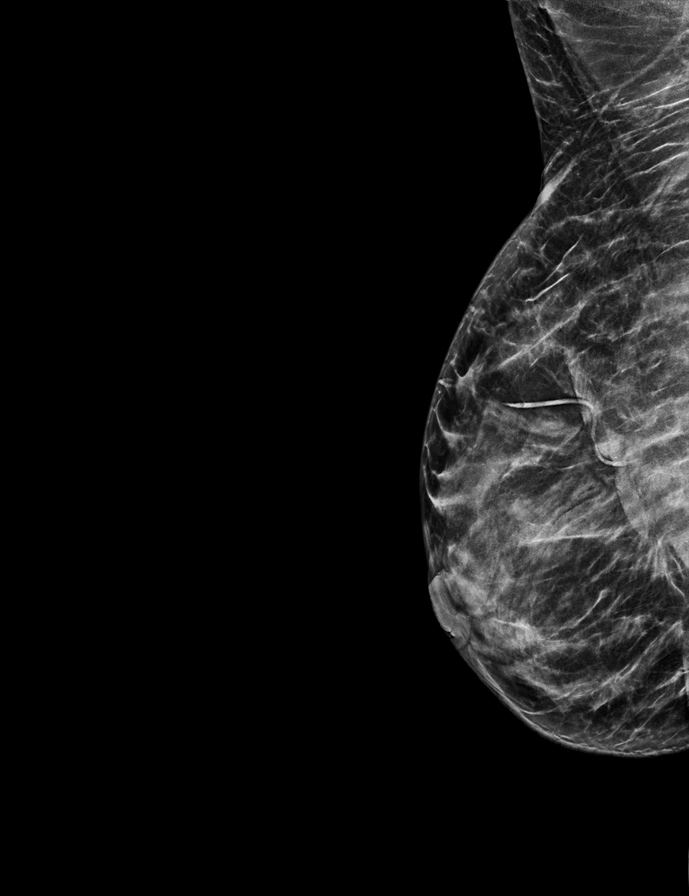

[L CC synth-2D]
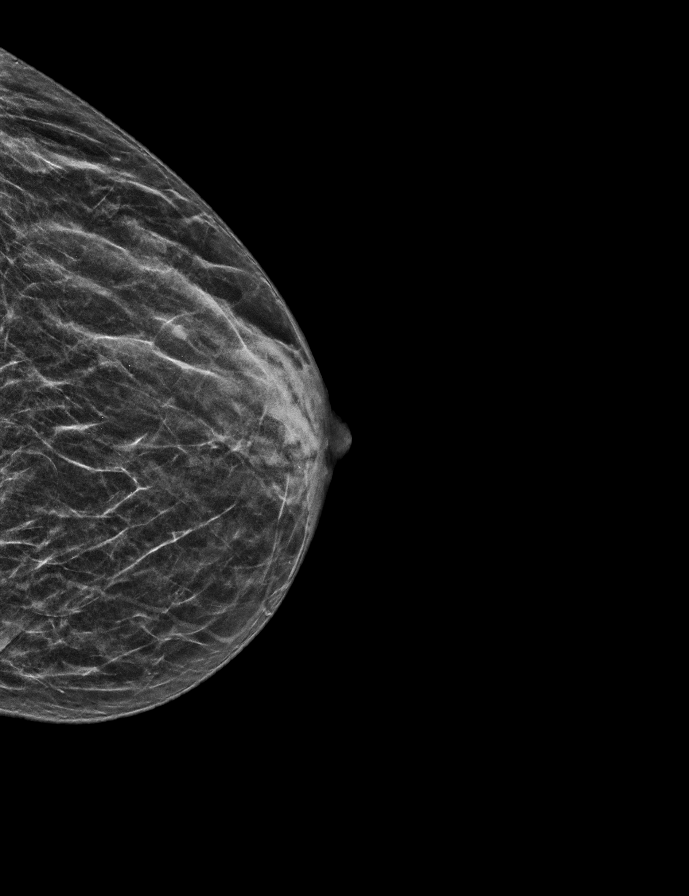

[L MLO tomo · 2 of 44 frames shown]
[frame 15/44]
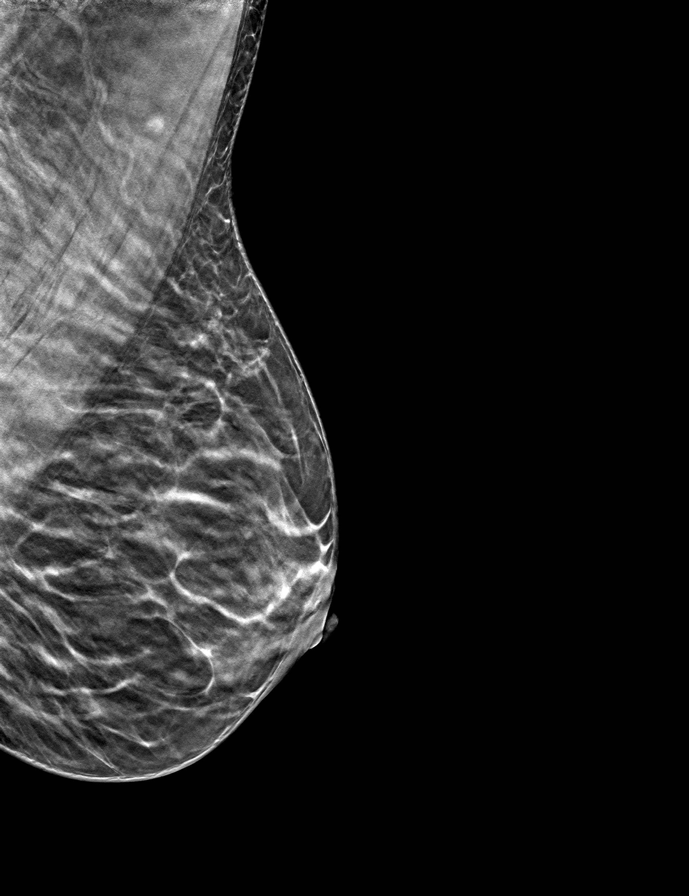
[frame 23/44]
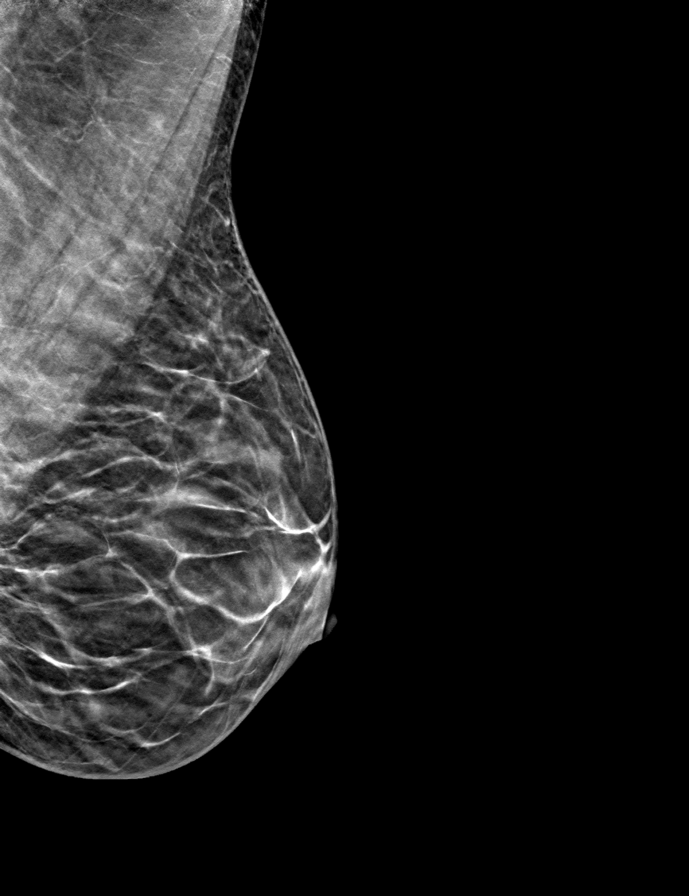

[9 of 37 positions shown; findings below may reference images not displayed]

ACR Breast Density Category b: There are scattered areas of
fibroglandular density.
FINDINGS: Right breast: A spot 2D magnification view of the lumpectomy site
was performed in addition to standard views. There are expected
postsurgical changes. No suspicious mass, distortion, or
microcalcifications are identified to suggest presence of
malignancy.

Left breast: No suspicious mass, distortion, or microcalcifications
are identified to suggest presence of malignancy.
IMPRESSION: Benign postsurgical changes in the right breast. No mammographic
evidence of malignancy bilaterally.

RECOMMENDATION:
Diagnostic bilateral mammogram in 1 year.

I have discussed the findings and recommendations with the patient.
If applicable, a reminder letter will be sent to the patient
regarding the next appointment.

BI-RADS CATEGORY  2: Benign.

## 2024-01-19 ENCOUNTER — Other Ambulatory Visit: Payer: Self-pay | Admitting: General Surgery

## 2024-01-19 DIAGNOSIS — Z853 Personal history of malignant neoplasm of breast: Secondary | ICD-10-CM

## 2024-02-12 ENCOUNTER — Ambulatory Visit
Admission: RE | Admit: 2024-02-12 | Discharge: 2024-02-12 | Disposition: A | Discharge status: Home / Self Care | Source: Ambulatory Visit | Attending: General Surgery | Admitting: General Surgery

## 2024-02-12 DIAGNOSIS — Z853 Personal history of malignant neoplasm of breast: Secondary | ICD-10-CM

## 2024-05-27 ENCOUNTER — Ambulatory Visit: Admitting: Plastic Surgery

## 2024-05-27 VITALS — BP 158/100 | HR 84 | Ht 64.0 in | Wt 146.0 lb

## 2024-05-27 DIAGNOSIS — Z923 Personal history of irradiation: Secondary | ICD-10-CM | POA: Diagnosis not present

## 2024-05-27 DIAGNOSIS — Z853 Personal history of malignant neoplasm of breast: Secondary | ICD-10-CM | POA: Diagnosis not present

## 2024-05-27 DIAGNOSIS — N651 Disproportion of reconstructed breast: Secondary | ICD-10-CM

## 2024-05-27 DIAGNOSIS — L905 Scar conditions and fibrosis of skin: Secondary | ICD-10-CM

## 2024-05-27 DIAGNOSIS — D0511 Intraductal carcinoma in situ of right breast: Secondary | ICD-10-CM

## 2024-05-27 NOTE — Progress Notes (Signed)
 Patient ID: Ebony Steele, female    DOB: [birth year], 48 y.o.   MRN: [id number]   Chief Complaint  Patient presents with   Breast Problem    The patient is a 48 year old female here with her husband for evaluation of her breasts.  She underwent a right partial mastectomy for breast cancer.  This was in 2024 at which time she developed a hematoma which had to be drained.  It then healed secondarily.  This may be why she now has scarring down of the skin to her chest wall.  This is creating a lot of tension and pulling of her breast upward.  It is also very hard to keep the area clean.  After the partial mastectomy she did have radiation and that is contributing to the scar contracture.  Her past medical history is listed below and I do not see anything that is limiting for repairing this.  She has significant asymmetry so a left breast mastopexy would improve her symmetry greatly.    Review of Systems  Constitutional: Negative.   HENT: Negative.    Eyes: Negative.   Respiratory: Negative.    Cardiovascular: Negative.   Gastrointestinal: Negative.   Endocrine: Negative.   Genitourinary: Negative.     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   a.) Bx (+) DCIS (Tis) RIGHT breast; s/p adjuvant XRT; declined endocrine therapy   Family history of colon cancer    GERD (gastroesophageal reflux disease)    Hypothyroidism    Insomnia    a.) takes trazodone  PRN   Menometrorrhagia    Personal history of radiation therapy     Past Surgical History:  Procedure Laterality Date   BREAST BIOPSY Right 12/19/2020   stereo bx/ x clip/  DUCTAL CARCINOMA IN SITU   BREAST EXCISIONAL BIOPSY Right 2024   excision of hematoma per pt-   BREAST LUMPECTOMY     BREAST LUMPECTOMY Right 01/09/2021   DUCTAL CARCINOMA IN SITU   DILATION AND CURETTAGE OF UTERUS  2005   INCISION  AND DRAINAGE ABSCESS Right 05/01/2023   Procedure: INCISION AND DRAINAGE Hematoma;  Surgeon: Rodolph Romano, MD;  Location: [facility] ORS;  Service: General;  Laterality: Right;   LAPAROSCOPY  2000   OOPHORECTOMY Right 2000   [place]   TUBAL LIGATION        Current Outpatient Medications:    albuterol  (VENTOLIN  HFA) 108 (90 Base) MCG/ACT inhaler, Inhale 1-2 puffs into the lungs every 6 (six) hours as needed for wheezing or shortness of breath., Disp: 1 each, Rfl: 2   ARIPiprazole  (ABILIFY ) 5 MG tablet, Take 1 tablet by mouth once daily, Disp: 90 tablet, Rfl: 1   buPROPion  (WELLBUTRIN  XL) 150 MG 24 hr tablet, Take 1 tablet (150 mg total) by mouth every other day., Disp: 90 tablet, Rfl: 1   busPIRone  (BUSPAR ) 5 MG tablet, Take 1 tablet by mouth three times daily as needed, Disp: 270 tablet, Rfl: 1   ibuprofen (ADVIL) 200 MG tablet, Take 200-400 mg by mouth every 6 (six) hours as needed for moderate pain., Disp: , Rfl:    levothyroxine  (SYNTHROID ) 88 MCG tablet, Take 1 tablet (88 mcg total) by mouth daily., Disp: 90 tablet, Rfl: 3   omeprazole  (PRILOSEC) 40 MG capsule, Take 1 capsule (40 mg total) by mouth daily. (Patient  taking differently: Take 40 mg by mouth daily as needed.), Disp: 90 capsule, Rfl: 3   polyethylene glycol (MIRALAX / GLYCOLAX) 17 g packet, Take 17 g by mouth daily as needed., Disp: , Rfl:    traZODone  (DESYREL ) 50 MG tablet, Take 1-2 tablets (50-100 mg total) by mouth at bedtime as needed for sleep. (Patient taking differently: Take 50-100 mg by mouth at bedtime as needed.), Disp: 60 tablet, Rfl: 3   Objective:   Vitals:   05/27/24 1207  BP: (!) 158/100  Pulse: 84  SpO2: 99%    Physical Exam Vitals reviewed.  Constitutional:      Appearance: Normal appearance.  HENT:     Head: Atraumatic.  Cardiovascular:     Rate and Rhythm: Normal rate.     Pulses: Normal pulses.  Pulmonary:     Effort: Pulmonary effort is normal.  Chest:    Abdominal:     Palpations:  Abdomen is soft.  Skin:    General: Skin is warm.  Neurological:     Mental Status: She is alert and oriented to person, place, and time.  Psychiatric:        Mood and Affect: Mood normal.        Behavior: Behavior normal.        Thought Content: Thought content normal.        Judgment: Judgment normal.     Assessment & Plan:  Skin scar contracture  Neoplasm of right breast, primary tumor staging category Tis: ductal carcinoma in situ (DCIS)  Recommend excision of scar contracture with fat grafting to diminish reoccurrence.  She does have significant asymmetry so a left breast mastopexy would correct that problem.  Pictures were obtained of the patient and placed in the chart with the patient's or guardian's permission.  Ebony RAMAN Harshan Kearley, DO

## 2024-07-12 ENCOUNTER — Other Ambulatory Visit: Payer: Self-pay | Admitting: General Surgery

## 2024-07-12 DIAGNOSIS — Z853 Personal history of malignant neoplasm of breast: Secondary | ICD-10-CM

## 2024-07-24 ENCOUNTER — Ambulatory Visit
Admission: RE | Admit: 2024-07-24 | Discharge: 2024-07-24 | Disposition: A | Discharge status: Home / Self Care | Source: Ambulatory Visit | Attending: General Surgery | Admitting: General Surgery

## 2024-07-24 DIAGNOSIS — Z853 Personal history of malignant neoplasm of breast: Secondary | ICD-10-CM

## 2024-07-24 MED ORDER — GADOPICLENOL 0.5 MMOL/ML IV SOLN
7.0000 mL | Freq: Once | INTRAVENOUS | Status: AC | PRN
  Administered 2024-07-24: 7 mL via INTRAVENOUS

## 2024-10-25 ENCOUNTER — Encounter: Admitting: Family Medicine
